# Patient Record
Sex: Female | Born: 1960 | Race: White | Hispanic: No | Marital: Married | Smoking: Former smoker
Health system: Southern US, Community
[De-identification: ages and names within clinical notes are randomized; demographics above are authoritative.]

## PROBLEM LIST (undated history)

## (undated) DIAGNOSIS — K8020 Calculus of gallbladder without cholecystitis without obstruction: Secondary | ICD-10-CM

## (undated) DIAGNOSIS — D649 Anemia, unspecified: Secondary | ICD-10-CM

## (undated) DIAGNOSIS — F329 Major depressive disorder, single episode, unspecified: Secondary | ICD-10-CM

## (undated) DIAGNOSIS — F32A Depression, unspecified: Secondary | ICD-10-CM

## (undated) DIAGNOSIS — E669 Obesity, unspecified: Secondary | ICD-10-CM

## (undated) DIAGNOSIS — K219 Gastro-esophageal reflux disease without esophagitis: Secondary | ICD-10-CM

## (undated) DIAGNOSIS — F419 Anxiety disorder, unspecified: Secondary | ICD-10-CM

## (undated) HISTORY — PX: CHOLECYSTECTOMY: SHX55

## (undated) HISTORY — DX: Major depressive disorder, single episode, unspecified: F32.9

## (undated) HISTORY — DX: Depression, unspecified: F32.A

## (undated) HISTORY — DX: Anxiety disorder, unspecified: F41.9

## (undated) HISTORY — DX: Gastro-esophageal reflux disease without esophagitis: K21.9

## (undated) HISTORY — DX: Anemia, unspecified: D64.9

## (undated) HISTORY — DX: Obesity, unspecified: E66.9

## (undated) HISTORY — DX: Calculus of gallbladder without cholecystitis without obstruction: K80.20

---

## 2000-03-22 ENCOUNTER — Encounter: Admission: RE | Admit: 2000-03-22 | Discharge: 2000-03-22 | Payer: Self-pay | Admitting: Family Medicine

## 2000-03-22 ENCOUNTER — Encounter: Payer: Self-pay | Admitting: Family Medicine

## 2001-03-25 ENCOUNTER — Encounter: Payer: Self-pay | Admitting: Family Medicine

## 2001-03-25 ENCOUNTER — Encounter: Admission: RE | Admit: 2001-03-25 | Discharge: 2001-03-25 | Payer: Self-pay | Admitting: Family Medicine

## 2001-03-25 ENCOUNTER — Encounter: Payer: Self-pay | Admitting: Internal Medicine

## 2005-09-04 ENCOUNTER — Ambulatory Visit (HOSPITAL_COMMUNITY): Payer: Self-pay | Admitting: Psychiatry

## 2005-11-28 ENCOUNTER — Ambulatory Visit (HOSPITAL_COMMUNITY): Payer: Self-pay | Admitting: Psychiatry

## 2006-01-28 ENCOUNTER — Ambulatory Visit (HOSPITAL_COMMUNITY): Payer: Self-pay | Admitting: Psychiatry

## 2006-04-24 ENCOUNTER — Ambulatory Visit (HOSPITAL_COMMUNITY): Payer: Self-pay | Admitting: Psychiatry

## 2006-07-24 ENCOUNTER — Ambulatory Visit (HOSPITAL_COMMUNITY): Payer: Self-pay | Admitting: Psychiatry

## 2006-08-21 ENCOUNTER — Ambulatory Visit (HOSPITAL_COMMUNITY): Payer: Self-pay | Admitting: Psychiatry

## 2006-10-15 ENCOUNTER — Ambulatory Visit (HOSPITAL_COMMUNITY): Payer: Self-pay | Admitting: Psychiatry

## 2007-04-11 ENCOUNTER — Ambulatory Visit (HOSPITAL_COMMUNITY): Payer: Self-pay | Admitting: Psychiatry

## 2007-10-03 ENCOUNTER — Ambulatory Visit (HOSPITAL_COMMUNITY): Payer: Self-pay | Admitting: Psychiatry

## 2008-04-09 ENCOUNTER — Ambulatory Visit (HOSPITAL_COMMUNITY): Payer: Self-pay | Admitting: Psychiatry

## 2008-04-28 ENCOUNTER — Ambulatory Visit: Payer: Self-pay | Admitting: Obstetrics & Gynecology

## 2008-04-28 ENCOUNTER — Encounter: Payer: Self-pay | Admitting: Physician Assistant

## 2008-05-04 ENCOUNTER — Ambulatory Visit (HOSPITAL_COMMUNITY): Admission: RE | Admit: 2008-05-04 | Discharge: 2008-05-04 | Payer: Self-pay | Admitting: Family Medicine

## 2008-05-13 ENCOUNTER — Ambulatory Visit: Payer: Self-pay | Admitting: Obstetrics & Gynecology

## 2008-10-07 ENCOUNTER — Ambulatory Visit (HOSPITAL_COMMUNITY): Payer: Self-pay | Admitting: Psychiatry

## 2008-12-31 ENCOUNTER — Ambulatory Visit (HOSPITAL_COMMUNITY): Payer: Self-pay | Admitting: Psychiatry

## 2009-03-18 ENCOUNTER — Ambulatory Visit (HOSPITAL_COMMUNITY): Payer: Self-pay | Admitting: Psychiatry

## 2009-05-06 ENCOUNTER — Ambulatory Visit (HOSPITAL_COMMUNITY): Payer: Self-pay | Admitting: Psychiatry

## 2009-05-10 ENCOUNTER — Ambulatory Visit (HOSPITAL_COMMUNITY): Payer: Self-pay | Admitting: Psychiatry

## 2009-05-18 ENCOUNTER — Ambulatory Visit (HOSPITAL_COMMUNITY): Payer: Self-pay | Admitting: Psychiatry

## 2009-05-25 ENCOUNTER — Ambulatory Visit (HOSPITAL_COMMUNITY): Payer: Self-pay | Admitting: Psychiatry

## 2009-06-02 ENCOUNTER — Ambulatory Visit (HOSPITAL_COMMUNITY): Payer: Self-pay | Admitting: Psychiatry

## 2009-06-16 ENCOUNTER — Ambulatory Visit (HOSPITAL_COMMUNITY): Payer: Self-pay | Admitting: Psychiatry

## 2009-06-23 ENCOUNTER — Ambulatory Visit (HOSPITAL_COMMUNITY): Payer: Self-pay | Admitting: Psychiatry

## 2009-07-06 ENCOUNTER — Ambulatory Visit: Payer: Self-pay | Admitting: Internal Medicine

## 2009-07-06 ENCOUNTER — Encounter: Payer: Self-pay | Admitting: Internal Medicine

## 2009-07-06 ENCOUNTER — Ambulatory Visit (HOSPITAL_COMMUNITY): Admission: RE | Admit: 2009-07-06 | Discharge: 2009-07-06 | Payer: Self-pay | Admitting: Internal Medicine

## 2009-07-06 DIAGNOSIS — IMO0002 Reserved for concepts with insufficient information to code with codable children: Secondary | ICD-10-CM

## 2009-07-06 DIAGNOSIS — I10 Essential (primary) hypertension: Secondary | ICD-10-CM | POA: Insufficient documentation

## 2009-07-06 DIAGNOSIS — F341 Dysthymic disorder: Secondary | ICD-10-CM

## 2009-07-06 DIAGNOSIS — K219 Gastro-esophageal reflux disease without esophagitis: Secondary | ICD-10-CM | POA: Insufficient documentation

## 2009-07-06 LAB — CONVERTED CEMR LAB
Eosinophils Absolute: 0.1 10*3/uL (ref 0.0–0.7)
Eosinophils Relative: 1 % (ref 0–5)
HCT: 35.9 % — ABNORMAL LOW (ref 36.0–46.0)
Lymphs Abs: 2.4 10*3/uL (ref 0.7–4.0)
MCV: 85.3 fL (ref >=78.0)
Monocytes Relative: 9 % (ref 3–12)
Neutrophils Relative %: 49 % (ref 43–77)
RBC: 4.21 M/uL (ref 3.87–5.11)
WBC: 5.8 10*3/uL (ref 4.0–10.5)

## 2009-07-07 DIAGNOSIS — I10 Essential (primary) hypertension: Secondary | ICD-10-CM | POA: Insufficient documentation

## 2009-07-07 DIAGNOSIS — F329 Major depressive disorder, single episode, unspecified: Secondary | ICD-10-CM

## 2009-07-07 DIAGNOSIS — F411 Generalized anxiety disorder: Secondary | ICD-10-CM | POA: Insufficient documentation

## 2009-07-07 DIAGNOSIS — M545 Low back pain: Secondary | ICD-10-CM

## 2009-07-21 ENCOUNTER — Ambulatory Visit: Payer: Self-pay | Admitting: Obstetrics and Gynecology

## 2009-07-22 ENCOUNTER — Ambulatory Visit: Payer: Self-pay | Admitting: Internal Medicine

## 2009-07-22 DIAGNOSIS — G2581 Restless legs syndrome: Secondary | ICD-10-CM

## 2009-07-27 ENCOUNTER — Ambulatory Visit (HOSPITAL_COMMUNITY): Payer: Self-pay | Admitting: Psychiatry

## 2009-08-01 ENCOUNTER — Ambulatory Visit (HOSPITAL_COMMUNITY): Payer: Self-pay | Admitting: Psychiatry

## 2009-08-12 ENCOUNTER — Ambulatory Visit (HOSPITAL_COMMUNITY): Payer: Self-pay | Admitting: Psychiatry

## 2009-08-30 ENCOUNTER — Ambulatory Visit (HOSPITAL_COMMUNITY): Payer: Self-pay | Admitting: Psychiatry

## 2009-09-07 ENCOUNTER — Ambulatory Visit (HOSPITAL_COMMUNITY): Payer: Self-pay | Admitting: Psychiatry

## 2009-09-19 IMAGING — US US PELVIS COMPLETE MODIFY
1 series · 14 of 25 positions shown · non-contrast
Comparison: None

CLINICAL DATA: Pelvic pain

TRANSABDOMINAL AND TRANSVAGINAL ULTRASOUND OF PELVIS
TECHNIQUE: Both transabdominal and transvaginal ultrasound
examinations of the pelvis were performed including evaluation of
the uterus, ovaries, adnexal regions, and pelvic cul-de-sac.

[Series 1: us pelvis complete modify · 0.28mm/px · 14 of 39 slices shown]
[im 1/39]
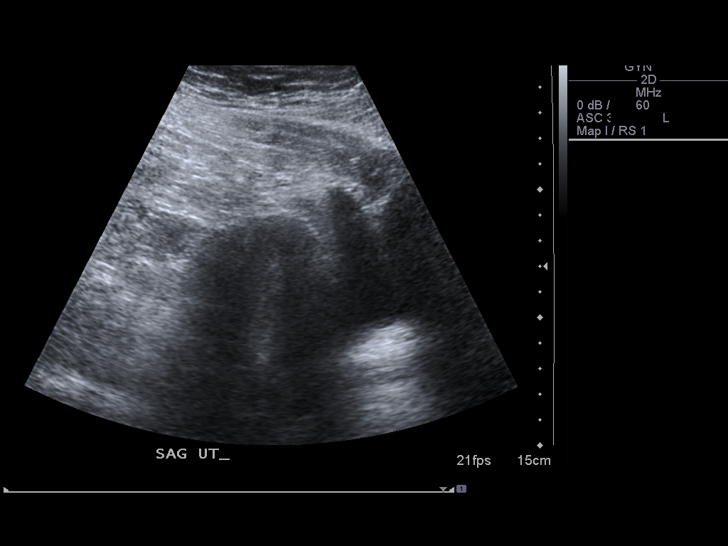
[im 4/39]
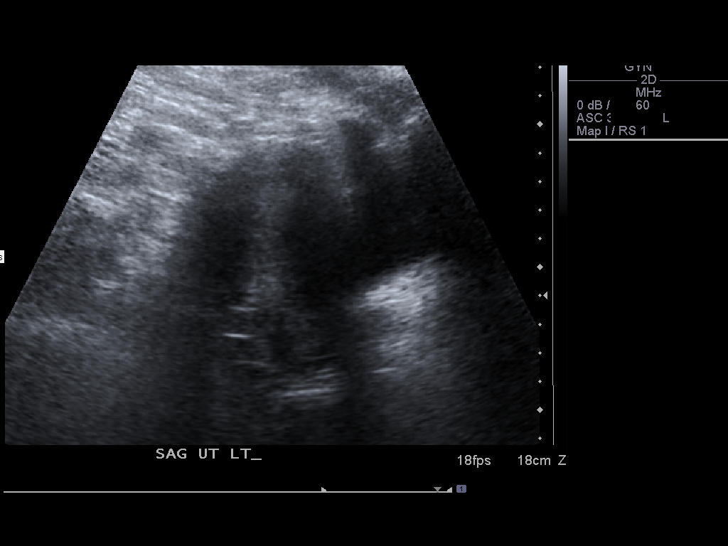
[im 7/39]
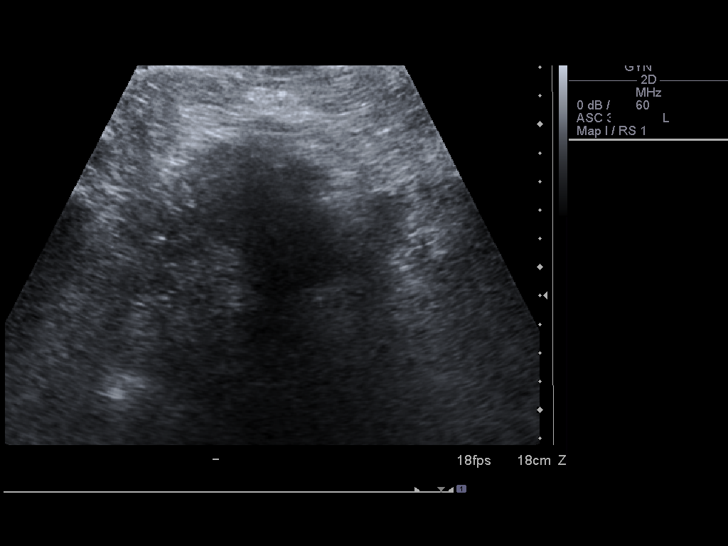
[im 10/39]
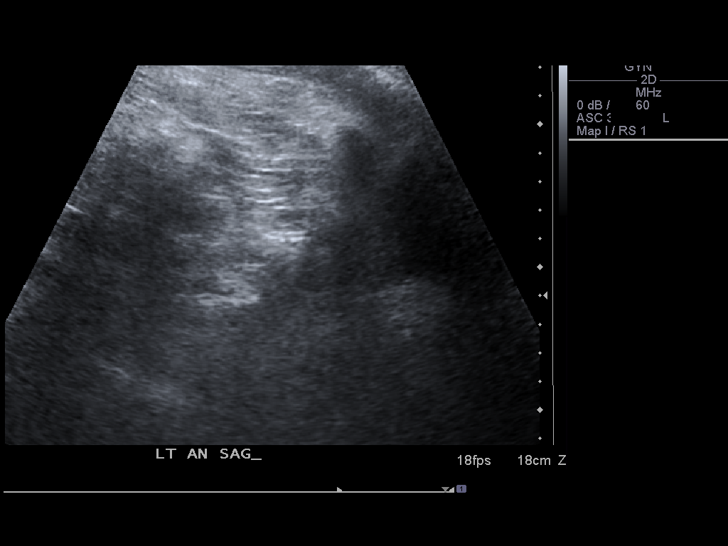
[im 13/39]
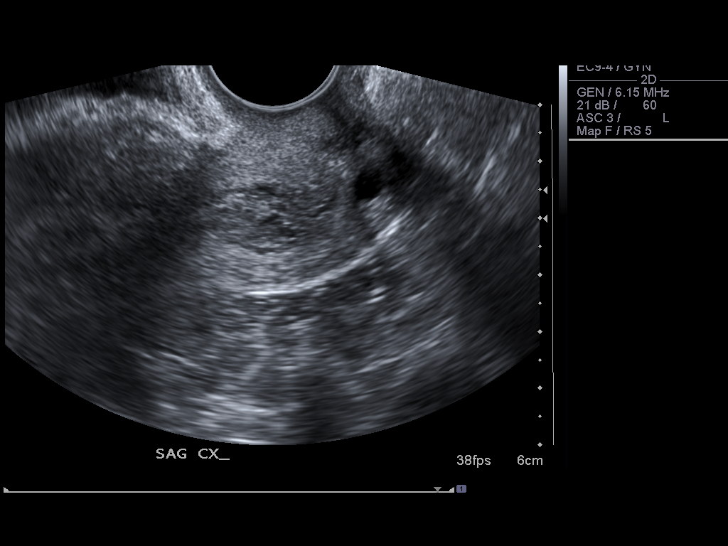
[im 15/39]
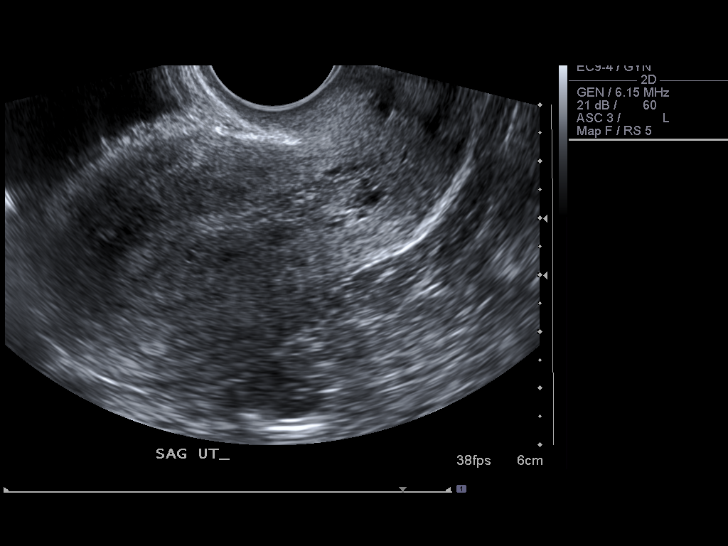
[im 18/39]
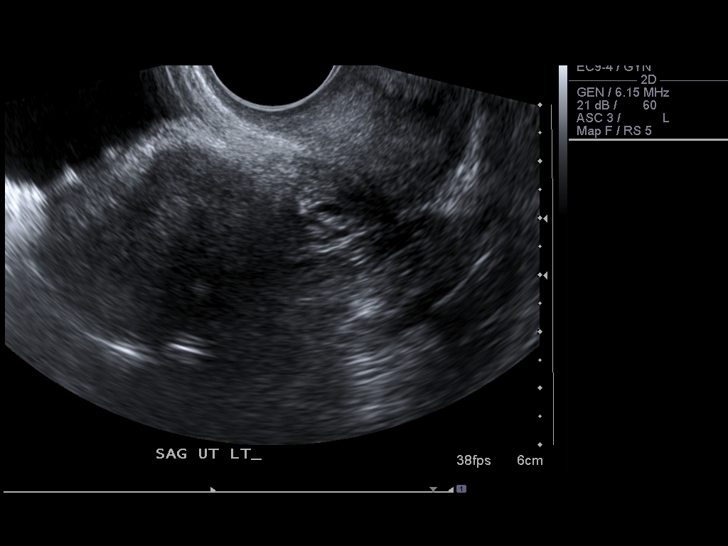
[im 21/39]
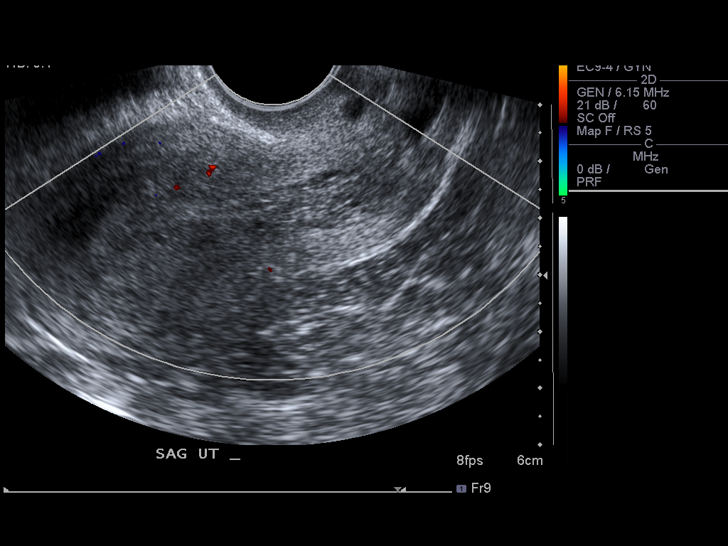
[im 24/39]
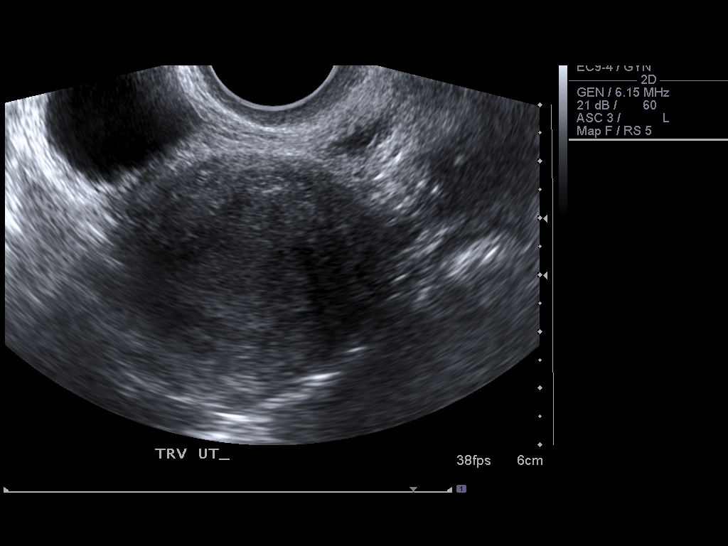
[im 26/39]
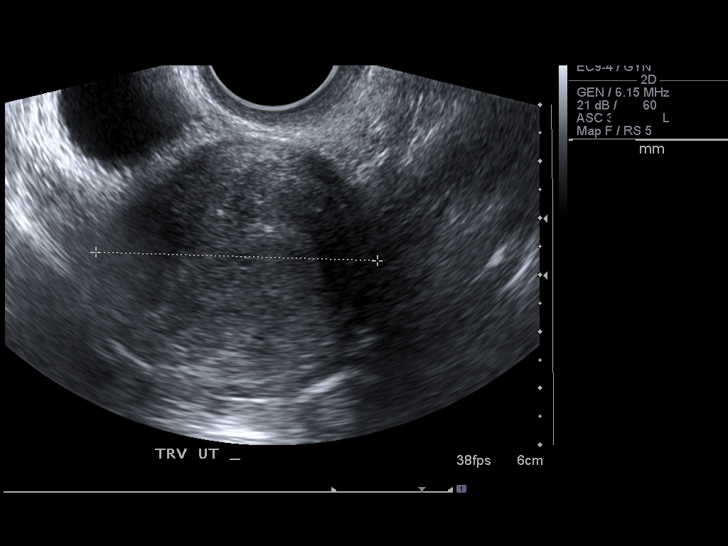
[im 29/39]
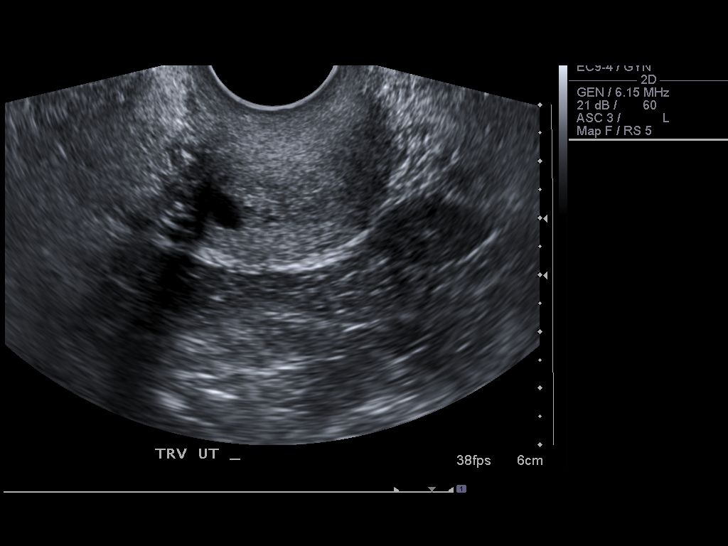
[im 32/39]
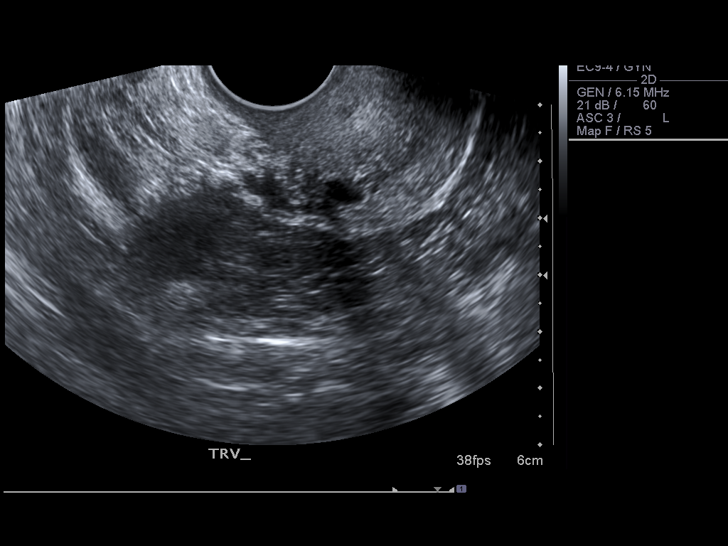
[im 35/39]
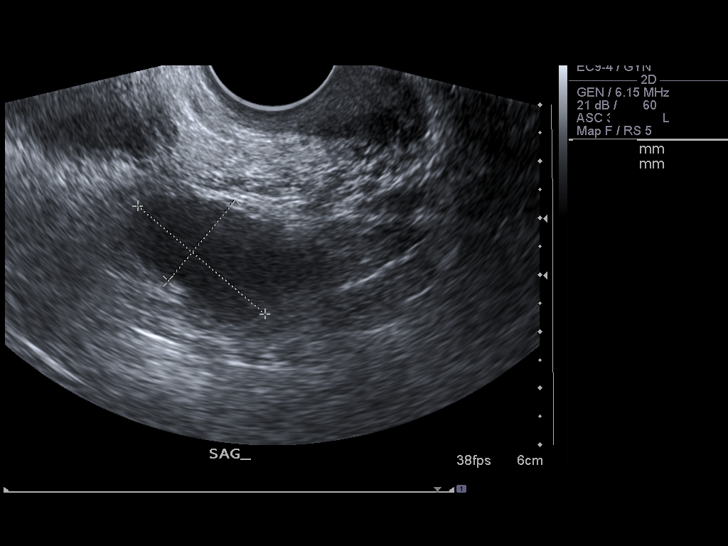
[im 39/39]
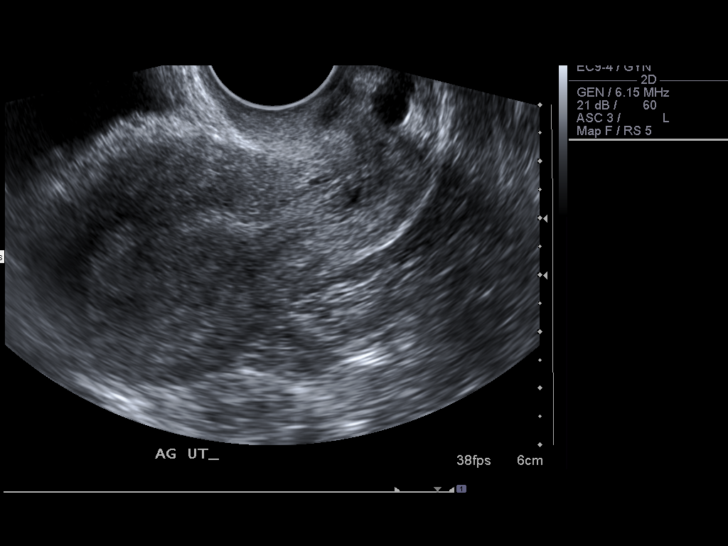

[14 of 25 positions shown; findings below may reference images not displayed]

FINDINGS: The uterus has a normal size and echotexture.  The
uterine dimensions are 8.1 x 4.3 x 5.0 cm.  The endometrium is
homogeneous and upper limits of normal in width, measuring 13 mm.

Both ovaries have a normal size and appearance.  The right ovary
measures 2.1 x 2.0 x 1.4 cm, and the left ovary measures 2.9 x
x 1.9 cm.  No adnexal masses or free pelvic fluid are identified.
IMPRESSION: Normal pelvic ultrasound.

## 2009-09-20 ENCOUNTER — Ambulatory Visit (HOSPITAL_COMMUNITY): Payer: Self-pay | Admitting: Psychiatry

## 2009-10-04 ENCOUNTER — Ambulatory Visit (HOSPITAL_COMMUNITY): Payer: Self-pay | Admitting: Psychiatry

## 2009-10-18 ENCOUNTER — Ambulatory Visit (HOSPITAL_COMMUNITY): Payer: Self-pay | Admitting: Psychiatry

## 2009-10-20 ENCOUNTER — Ambulatory Visit: Payer: Self-pay | Admitting: Internal Medicine

## 2009-10-20 DIAGNOSIS — T148XXA Other injury of unspecified body region, initial encounter: Secondary | ICD-10-CM | POA: Insufficient documentation

## 2009-10-20 LAB — CONVERTED CEMR LAB
Cocaine Metabolites: NEGATIVE
Creatinine,U: 42.5 mg/dL
Opiates: NEGATIVE
Phencyclidine (PCP): NEGATIVE

## 2009-10-25 ENCOUNTER — Ambulatory Visit: Payer: Self-pay | Admitting: Infectious Disease

## 2009-11-02 ENCOUNTER — Ambulatory Visit (HOSPITAL_COMMUNITY): Payer: Self-pay | Admitting: Psychiatry

## 2009-11-03 LAB — CONVERTED CEMR LAB
Eosinophils Relative: 1 % (ref 0–5)
HCT: 31 % — ABNORMAL LOW (ref 36.0–46.0)
Hemoglobin: 10.1 g/dL — ABNORMAL LOW (ref 12.0–15.0)
Lymphocytes Relative: 34 % (ref 12–46)
Lymphs Abs: 2 10*3/uL (ref 0.7–4.0)
Monocytes Absolute: 0.5 10*3/uL (ref 0.1–1.0)
Monocytes Relative: 9 % (ref 3–12)
RBC: 3.8 M/uL — ABNORMAL LOW (ref 3.87–5.11)
RDW: 15.1 % (ref 11.5–15.5)
WBC: 5.8 10*3/uL (ref 4.0–10.5)

## 2009-11-04 ENCOUNTER — Ambulatory Visit (HOSPITAL_COMMUNITY): Payer: Self-pay | Admitting: Psychiatry

## 2009-11-09 ENCOUNTER — Encounter: Admission: RE | Admit: 2009-11-09 | Discharge: 2009-12-14 | Payer: Self-pay | Admitting: Internal Medicine

## 2009-11-10 ENCOUNTER — Encounter: Payer: Self-pay | Admitting: Internal Medicine

## 2009-11-17 ENCOUNTER — Encounter (INDEPENDENT_AMBULATORY_CARE_PROVIDER_SITE_OTHER): Payer: Self-pay | Admitting: Internal Medicine

## 2009-11-17 ENCOUNTER — Ambulatory Visit (HOSPITAL_COMMUNITY): Payer: Self-pay | Admitting: Psychiatry

## 2009-11-17 ENCOUNTER — Ambulatory Visit: Payer: Self-pay | Admitting: Internal Medicine

## 2009-11-17 DIAGNOSIS — D649 Anemia, unspecified: Secondary | ICD-10-CM

## 2009-11-17 LAB — CONVERTED CEMR LAB: RBC Folate: 398 ng/mL (ref 180–600)

## 2009-11-18 ENCOUNTER — Telehealth (INDEPENDENT_AMBULATORY_CARE_PROVIDER_SITE_OTHER): Payer: Self-pay | Admitting: Internal Medicine

## 2009-11-18 DIAGNOSIS — K0850 Unsatisfactory restoration of tooth, unspecified: Secondary | ICD-10-CM | POA: Insufficient documentation

## 2009-11-28 ENCOUNTER — Ambulatory Visit (HOSPITAL_COMMUNITY): Payer: Self-pay | Admitting: Psychiatry

## 2009-12-02 ENCOUNTER — Encounter (INDEPENDENT_AMBULATORY_CARE_PROVIDER_SITE_OTHER): Payer: Self-pay | Admitting: *Deleted

## 2009-12-06 ENCOUNTER — Ambulatory Visit: Payer: Self-pay | Admitting: Internal Medicine

## 2009-12-06 DIAGNOSIS — K921 Melena: Secondary | ICD-10-CM | POA: Insufficient documentation

## 2009-12-06 LAB — CONVERTED CEMR LAB
Ferritin: 9 ng/mL — ABNORMAL LOW (ref 10–291)
Saturation Ratios: 8 % — ABNORMAL LOW (ref 20–55)
TIBC: 430 ug/dL (ref 250–470)
UIBC: 396 ug/dL
Vitamin B-12: 362 pg/mL (ref 211–911)

## 2009-12-13 ENCOUNTER — Encounter: Payer: Self-pay | Admitting: Internal Medicine

## 2009-12-14 ENCOUNTER — Ambulatory Visit: Payer: Self-pay | Admitting: Internal Medicine

## 2009-12-29 ENCOUNTER — Ambulatory Visit (HOSPITAL_COMMUNITY): Payer: Self-pay | Admitting: Psychiatry

## 2010-01-10 ENCOUNTER — Ambulatory Visit: Payer: Self-pay | Admitting: Gastroenterology

## 2010-01-11 ENCOUNTER — Telehealth: Payer: Self-pay | Admitting: Gastroenterology

## 2010-01-12 ENCOUNTER — Telehealth: Payer: Self-pay | Admitting: Internal Medicine

## 2010-01-13 ENCOUNTER — Ambulatory Visit (HOSPITAL_COMMUNITY): Payer: Self-pay | Admitting: Psychiatry

## 2010-01-18 ENCOUNTER — Telehealth: Payer: Self-pay | Admitting: Internal Medicine

## 2010-01-31 ENCOUNTER — Ambulatory Visit (HOSPITAL_COMMUNITY): Payer: Self-pay | Admitting: Psychiatry

## 2010-02-03 ENCOUNTER — Ambulatory Visit (HOSPITAL_COMMUNITY): Payer: Self-pay | Admitting: Psychiatry

## 2010-02-14 ENCOUNTER — Ambulatory Visit: Payer: Self-pay | Admitting: Gastroenterology

## 2010-02-14 LAB — HM COLONOSCOPY: HM Colonoscopy: NEGATIVE

## 2010-02-16 ENCOUNTER — Telehealth: Payer: Self-pay | Admitting: Gastroenterology

## 2010-02-16 ENCOUNTER — Encounter (INDEPENDENT_AMBULATORY_CARE_PROVIDER_SITE_OTHER): Payer: Self-pay | Admitting: *Deleted

## 2010-02-17 ENCOUNTER — Ambulatory Visit: Payer: Self-pay | Admitting: Gastroenterology

## 2010-02-17 ENCOUNTER — Telehealth: Payer: Self-pay | Admitting: *Deleted

## 2010-02-23 ENCOUNTER — Ambulatory Visit: Payer: Self-pay | Admitting: Gastroenterology

## 2010-02-24 ENCOUNTER — Ambulatory Visit (HOSPITAL_COMMUNITY): Payer: Self-pay | Admitting: Psychiatry

## 2010-02-24 ENCOUNTER — Ambulatory Visit: Payer: Self-pay | Admitting: Gastroenterology

## 2010-02-27 ENCOUNTER — Telehealth: Payer: Self-pay | Admitting: Gastroenterology

## 2010-02-27 LAB — CONVERTED CEMR LAB
Eosinophils Relative: 1.4 % (ref 0.0–5.0)
Lymphocytes Relative: 29 % (ref 12.0–46.0)
MCV: 80.4 fL (ref 78.0–100.0)
Monocytes Absolute: 0.5 10*3/uL (ref 0.1–1.0)
Monocytes Relative: 9.6 % (ref 3.0–12.0)
Neutrophils Relative %: 59.5 % (ref 43.0–77.0)
Platelets: 273 10*3/uL (ref 150.0–400.0)
WBC: 5.3 10*3/uL (ref 4.5–10.5)

## 2010-03-03 ENCOUNTER — Ambulatory Visit: Payer: Self-pay | Admitting: Internal Medicine

## 2010-03-03 DIAGNOSIS — K648 Other hemorrhoids: Secondary | ICD-10-CM | POA: Insufficient documentation

## 2010-03-09 ENCOUNTER — Ambulatory Visit (HOSPITAL_COMMUNITY): Payer: Self-pay | Admitting: Psychiatry

## 2010-03-16 ENCOUNTER — Telehealth: Payer: Self-pay | Admitting: Gastroenterology

## 2010-03-17 ENCOUNTER — Telehealth: Payer: Self-pay | Admitting: Internal Medicine

## 2010-03-20 ENCOUNTER — Ambulatory Visit: Payer: Self-pay | Admitting: Gastroenterology

## 2010-03-27 ENCOUNTER — Encounter: Payer: Self-pay | Admitting: Gastroenterology

## 2010-03-30 ENCOUNTER — Ambulatory Visit (HOSPITAL_COMMUNITY): Payer: Self-pay | Admitting: Psychiatry

## 2010-04-19 ENCOUNTER — Telehealth: Payer: Self-pay | Admitting: *Deleted

## 2010-05-04 ENCOUNTER — Ambulatory Visit (HOSPITAL_COMMUNITY): Payer: Self-pay | Admitting: Psychiatry

## 2010-05-17 ENCOUNTER — Ambulatory Visit (HOSPITAL_COMMUNITY)
Admission: RE | Admit: 2010-05-17 | Discharge: 2010-05-17 | Payer: Self-pay | Source: Home / Self Care | Attending: Internal Medicine | Admitting: Internal Medicine

## 2010-05-17 ENCOUNTER — Ambulatory Visit: Payer: Self-pay | Admitting: Internal Medicine

## 2010-05-17 DIAGNOSIS — M79609 Pain in unspecified limb: Secondary | ICD-10-CM | POA: Insufficient documentation

## 2010-05-24 ENCOUNTER — Ambulatory Visit (HOSPITAL_COMMUNITY)
Admission: RE | Admit: 2010-05-24 | Discharge: 2010-05-24 | Payer: Self-pay | Source: Home / Self Care | Attending: Internal Medicine | Admitting: Internal Medicine

## 2010-05-24 LAB — HM MAMMOGRAPHY: HM Mammogram: NEGATIVE

## 2010-06-05 ENCOUNTER — Ambulatory Visit (HOSPITAL_COMMUNITY): Payer: Self-pay | Admitting: Psychiatry

## 2010-06-13 ENCOUNTER — Ambulatory Visit
Admission: RE | Admit: 2010-06-13 | Discharge: 2010-06-13 | Payer: Self-pay | Source: Home / Self Care | Attending: Family Medicine | Admitting: Family Medicine

## 2010-06-13 DIAGNOSIS — M722 Plantar fascial fibromatosis: Secondary | ICD-10-CM | POA: Insufficient documentation

## 2010-06-20 ENCOUNTER — Ambulatory Visit (HOSPITAL_COMMUNITY): Admission: RE | Admit: 2010-06-20 | Payer: Self-pay | Source: Home / Self Care | Admitting: Psychiatry

## 2010-06-21 ENCOUNTER — Telehealth: Payer: Self-pay | Admitting: *Deleted

## 2010-06-25 ENCOUNTER — Encounter: Payer: Self-pay | Admitting: Internal Medicine

## 2010-06-26 ENCOUNTER — Encounter: Payer: Self-pay | Admitting: Infectious Diseases

## 2010-07-03 ENCOUNTER — Telehealth (INDEPENDENT_AMBULATORY_CARE_PROVIDER_SITE_OTHER): Payer: Self-pay | Admitting: *Deleted

## 2010-07-04 NOTE — Progress Notes (Signed)
Summary: Mailed pts hemoccult cards today  Phone Note Outgoing Call Call back at [facility] Phone [phone]   Call placed by: Merri Ray CMA Duncan Dull),  February 27, 2010 1:41 PM Summary of Call: Called pt to inform that I am mailing out her hemoccult cards today recommended by Dr Arlyce Dice.L/M to return call if she has any questions Initial call taken by: Merri Ray CMA Duncan Dull),  February 27, 2010 1:42 PM

## 2010-07-04 NOTE — Assessment & Plan Note (Signed)
Summary: ACUTE/MADERA/PER PT NEEDS OV AFTER ENDOSCOPY DONE/CH   Vital Signs:  Patient profile:   50 year old female Height:      66 inches (167.64 cm) Weight:      227.9 pounds (103.59 kg) BMI:     36.92 Temp:     97.7 degrees F oral Pulse rate:   86 / minute BP sitting:   130 / 79  (right arm) Cuff size:   regular  Vitals Entered By: Chinita Pester RN (March 03, 2010 3:14 PM) CC: Medication refills. States iron pills make her sick (diarrhea). Bleeding hemorrhoids. Flu shot. Is Patient Diabetic? No Pain Assessment Patient in pain? yes     Location: left leg Intensity: 4 Type: aching Onset of pain  Intermittent;recently took pain med. Nutritional Status BMI of > 30 = obese  Have you ever been in a relationship where you felt threatened, hurt or afraid?No   Does patient need assistance? Functional Status Self care Ambulation Normal   Primary Care Provider:  Vassie Loll MD  CC:  Medication refills. States iron pills make her sick (diarrhea). Bleeding hemorrhoids. Flu shot..  History of Present Illness: 50 y/o w with pmf of hiatal hernia, diverticulosis and hemorrhoids and iron def anemia from GI bleed in past comes for prescriprion refill. she is currently having some streaky blood in her stool which she has been having since a while. she has not had frank blood in stool. has cbc followed at [facility]astro[facility]ist[facility] office. no new complaiints today except itching at hemorrhoidal site.   Depression History:      The patient denies a depressed mood most of the day and a diminished interest in her usual daily activities.        Comments:  "Ups and downs;especially this last week.".   Preventive Screening-Counseling & Management  Alcohol-Tobacco     Alcohol drinks/day: 0     Smoking Status: quit  Caffeine-Diet-Exercise     Does Patient Exercise: no  Current Medications (verified): 1)  Alprazolam 1 Mg Tabs (Alprazolam) .... Take 1 Tablet By Mouth Upto 5 Times A Day 2)   Zolpidem Tartrate 10 Mg Tabs (Zolpidem Tartrate) .... Take 1 Tab By Mouth At Bedtime As Needed As Needed For Insomnia. 3)  Fluoxetine Hcl 40 Mg Caps (Fluoxetine Hcl) .... Take 1 Tablet By Mouth Once A Day 4)  Flexeril 10 Mg Tabs (Cyclobenzaprine Hcl) .... Take 1 Tablet By Mouth Two Times A Day 5)  Omeprazole 20 Mg Cpdr (Omeprazole) .... Take 1 Capsule By Mouth Twice Daily. 6)  Vicodin 5-500 Mg Tabs (Hydrocodone-Acetaminophen) .... Take 1 Tablet Three Time A Day As Needed For Pain. 7)  Hydrocortisone Acetate 25 Mg Supp (Hydrocortisone Acetate) .... 2-3 Times A Day As Needed For Hemorrhoids  Allergies (verified): No Known Drug Allergies  Review of Systems  The patient denies anorexia, fever, weight loss, weight gain, vision loss, decreased hearing, hoarseness, chest pain, syncope, dyspnea on exertion, peripheral edema, prolonged cough, headaches, hemoptysis, abdominal pain, melena, hematochezia, severe indigestion/heartburn, hematuria, incontinence, genital sores, muscle weakness, suspicious skin lesions, transient blindness, difficulty walking, depression, unusual weight change, abnormal bleeding, enlarged lymph nodes, angioedema, breast masses, and testicular masses.    Physical Exam  General:  alert, well-developed, and well-nourished.   Head:  normocephalic and atraumatic.   Eyes:  vision grossly intact, pupils equal, pupils round, and pupils reactive to light.   Ears:  R ear normal and L ear normal.   Nose:  no external deformity.   Neck:  supple, full ROM, and no masses.   Chest Wall:  no deformities.   Lungs:  normal respiratory effort, no intercostal retractions, no accessory muscle use, normal breath sounds, and no wheezes.   Heart:  normal rate, regular rhythm, and no murmur.   Abdomen:  soft, non-tender, normal bowel sounds, and no distention.   Msk:  normal ROM and no joint tenderness.   Pulses:  dorsalis pedis pulses normal bilaterally  Extremities:  no edema Neurologic:  non  focal   Impression & Recommendations:  Problem # 1:  HYPERTENSION (ICD-401.9) welll controlled.   BP today: 130/79 Prior BP: 148/98 (01/10/2010)  Labs Reviewed: K+: 4.7 (07/06/2009) Creat: : 0.77 (07/06/2009)   Chol: 182 (07/06/2009)   HDL: 67 (07/06/2009)   LDL: 99 (07/06/2009)   TG: 80 (07/06/2009)  Problem # 2:  GERD (ICD-530.81) will give prescription of ppi  Her updated medication list for this problem includes:    Omeprazole 20 Mg Cpdr (Omeprazole) .Marland Kitchen... Take 1 capsule by mouth twice daily.  EGD: DONE (02/23/2010)  Labs Reviewed: Hgb: 10.2 (02/24/2010)   Hct: 30.3 (02/24/2010)  Problem # 3:  HEMOCCULT POSITIVE STOOL (ICD-578.1) has recent cbc at [facility] office that is at baseline. no change.   Problem # 4:  HEMORRHOIDS, INTERNAL (ICD-455.0) will give steroid cream. Ask her to repeat hemoccult cards once hemmrhoidal inflammation subsides. will ask her to return to clinic in case she starts having frank hematochezia  Complete Medication List: 1)  Alprazolam 1 Mg Tabs (Alprazolam) .... Take 1 tablet by mouth upto 5 times a day 2)  Zolpidem Tartrate 10 Mg Tabs (Zolpidem tartrate) .... Take 1 tab by mouth at bedtime as needed as needed for insomnia. 3)  Fluoxetine Hcl 40 Mg Caps (Fluoxetine hcl) .... Take 1 tablet by mouth once a day 4)  Flexeril 10 Mg Tabs (Cyclobenzaprine hcl) .... Take 1 tablet by mouth two times a day 5)  Omeprazole 20 Mg Cpdr (Omeprazole) .... Take 1 capsule by mouth twice daily. 6)  Vicodin 5-500 Mg Tabs (Hydrocodone-acetaminophen) .... Take 1 tablet three time a day as needed for pain. 7)  Hydrocortisone Acetate 25 Mg Supp (Hydrocortisone acetate) .... 2-3 times a day as needed for hemorrhoids  Other Orders: Influenza Vaccine NON MCR (16109)  Patient Instructions: 1)  Please schedule a follow-up appointment in 2 months. Come fasting at that time Prescriptions: HYDROCORTISONE ACETATE 25 MG SUPP (HYDROCORTISONE ACETATE) 2-3 times a day as needed for  hemorrhoids  #3 x 6   Entered and Authorized by:   Bethel Born MD   Signed by:   Bethel Born MD on 03/03/2010   Method used:   Faxed to ...       [facility] Department (retail)       [address] [place], [place]  [address]       Ph: [phone]       Fax: [phone]   RxID:   [id number] OMEPRAZOLE 20 MG CPDR (OMEPRAZOLE) Take 1 capsule by mouth twice daily.  #62 x 6   Entered and Authorized by:   Bethel Born MD   Signed by:   Bethel Born MD on 03/03/2010   Method used:   Faxed to ...       [facility] Department (retail)       [address] [place], [place]  [address]       Ph: [phone]  Fax: [phone]   RxID:   [id number] FLEXERIL 10 MG TABS (CYCLOBENZAPRINE HCL) Take 1 tablet by mouth two times a day  #62 x 6   Entered and Authorized by:   Bethel Born MD   Signed by:   Bethel Born MD on 03/03/2010   Method used:   Faxed to ...       [facility] Department (retail)       [address] [place], [place]  [address]       Ph: [phone]       Fax: [phone]   RxID:   [id number]   Prevention & Chronic Care Immunizations   Influenza vaccine: Fluvax Non-MCR  (03/03/2010)   Influenza vaccine deferral: Deferred  (12/14/2009)    Tetanus booster: Not documented   Td booster deferral: Deferred  (12/14/2009)    Pneumococcal vaccine: Not documented   Pneumococcal vaccine deferral: Deferred  (07/06/2009)  Other Screening   Pap smear: Not documented   Pap smear action/deferral: Deferred  (12/14/2009)    Mammogram: Not documented   Mammogram action/deferral: Ordered  (12/14/2009)   Smoking status: quit  (03/03/2010)  Lipids   Total Cholesterol: 182  (07/06/2009)   LDL: 99  (07/06/2009)   LDL Direct: Not documented   HDL: 67  (07/06/2009)   Triglycerides: 80  (07/06/2009)  Hypertension   Last Blood Pressure: 130 / 79  (03/03/2010)   Serum creatinine: 0.77  (07/06/2009)    Serum potassium 4.7  (07/06/2009)  Self-Management Support :   Personal Goals (by the next clinic visit) :      Personal blood pressure goal: 140/90  (07/06/2009)   Patient will work on the following items until the next clinic visit to reach self-care goals:     Medications and monitoring: take my medicines every day, check my blood pressure, bring all of my medications to every visit  (03/03/2010)     Eating: use fresh or frozen vegetables, eat foods that are low in salt, eat baked foods instead of fried foods  (03/03/2010)     Activity: take a 30 minute walk every day, take the stairs instead of the elevator, park at the far end of the parking lot  (07/06/2009)    Hypertension self-management support: Written self-care plan  (03/03/2010)   Hypertension self-care plan printed.     Influenza Vaccine    Vaccine Type: Fluvax Non-MCR    Site: right deltoid    Mfr: GlaxoSmithKline    Dose: 0.5 ml    Route: IM    Given by: Chinita Pester RN    Exp. Date: 12/02/2010    Lot #: [id number]    VIS given: 12/27/09 version given March 03, 2010.  Flu Vaccine Consent Questions    Do you have a history of severe allergic reactions to this vaccine? no    Any prior history of allergic reactions to egg and/or gelatin? no    Do you have a sensitivity to the preservative Thimersol? no    Do you have a past history of Guillan-Barre Syndrome? no    Do you currently have an acute febrile illness? no    Have you ever had a severe reaction to latex? no    Vaccine information given and explained to patient? yes    Are you currently pregnant? no

## 2010-07-04 NOTE — Procedures (Addendum)
Summary: Upper Endoscopy  Patient: Brenda Delacruz Note: All result statuses are Final unless otherwise noted.  Tests: (1) Upper Endoscopy (EGD)   EGD Upper Endoscopy       DONE     [facility]     [address]     [place], [place]  [address]          ENDOSCOPY PROCEDURE REPORT          PATIENT:  Brenda, Delacruz  MR#:  [id number]     BIRTHDATE:  [birth year], 49 yrs. old  GENDER:  female          ENDOSCOPIST:  Barbette Hair. Arlyce Dice, MD     Referred by:          PROCEDURE DATE:  02/23/2010     PROCEDURE:  EGD, diagnostic     ASA CLASS:  Class II     INDICATIONS:  iron deficiency anemia          MEDICATIONS:   Fentanyl 50 mcg IV, Versed 8 mg IV, glycopyrrolate     (Robinal) 0.2 mg IV, 0.6cc simethancone 0.6 cc PO     TOPICAL ANESTHETIC:  Exactacain Spray          DESCRIPTION OF PROCEDURE:   After the risks benefits and     alternatives of the procedure were thoroughly explained, informed     consent was obtained.  The LB [id number] endoscope was     introduced through the mouth and advanced to the third portion of     the duodenum, without limitations.  The instrument was slowly     withdrawn as the mucosa was fully examined.     <<PROCEDUREIMAGES>>          A hiatal hernia was found. 3cm sliding hiatal hernia  Otherwise     the examination was normal (see image1, image2, image3, image4,     image5, image7, and image8).    Retroflexed views revealed no     abnormalities.    The scope was then withdrawn from the patient     and the procedure completed.          COMPLICATIONS:  None          ENDOSCOPIC IMPRESSION:     1) Hiatal hernia     2) Otherwise normal examination     RECOMMENDATIONS:     1) hemmoccult stools     2) My office will arrange for you to have labs checked (CBC).          REPEAT EXAM:  No          ______________________________     Barbette Hair. Arlyce Dice, MD          CC:  Janae Sauce MD          n.     Rosalie DoctorBarbette Hair.  Tirzah Fross at 02/23/2010 08:24 AM          [place], [phone]  Note: An exclamation mark (!) indicates a result that was not dispersed into the flowsheet. Document Creation Date: 02/23/2010 8:24 AM _______________________________________________________________________  (1) Order result status: Final Collection or observation date-time: 02/23/2010 08:19 Requested date-time:  Receipt date-time:  Reported date-time:  Referring Physician:   Ordering Physician: Melvia Heaps [phone]) Specimen Source:  Source: Launa Grill Order Number: [id number] Lab site:

## 2010-07-04 NOTE — Progress Notes (Signed)
Summary: refill/gg  Phone Note Refill Request  on January 12, 2010 5:44 PM  Refills Requested: Medication #1:  FLEXERIL 10 MG TABS 1 tablet by mouth twice a day.  Method Requested: Fax to Local Pharmacy Initial call taken by: Merrie Roof RN,  January 12, 2010 5:45 PM  Follow-up for Phone Call        Refill approved-nurse to complete    New/Updated Medications: FLEXERIL 10 MG TABS (CYCLOBENZAPRINE HCL) 1 tablet by mouth at bedtime. Prescriptions: FLEXERIL 10 MG TABS (CYCLOBENZAPRINE HCL) 1 tablet by mouth at bedtime.  #31 x 1   Entered and Authorized by:   Vassie Loll MD   Signed by:   Vassie Loll MD on 01/13/2010   Method used:   Telephoned to ...       [facility] Department (retail)       [address] [place], [place]  [address]       Ph: [phone]       Fax: [phone]   RxID:   [id number]   Appended Document: refill/gg Rx called in

## 2010-07-04 NOTE — Assessment & Plan Note (Addendum)
Summary: iron def. anemia--ch.   History of Present Illness Visit Type: Initial Consult Primary GI MD: Melvia Heaps MD [facility] Primary Provider: Lorrin Mais Requesting Provider: Elna Breslow Pokharel,MD Chief Complaint: Anemia History of Present Illness:   Brenda Delacruz is a 50 year old white female referred at the request of Dr. Aleene Davidson for evaluation of anemia.  Heme-positive stool and microcytic anemia were noted on routine testing.  In May, 2011 hemoglobin was 10.1.  Ferritin level was 9.  The patient occasionally has seen small amounts of blood  mixed with her stools which she attributes to hemorrhoids.  She denies change in bowel habits, melena, abdominal or rectal pain.  She is on no gastric irritants including nonsteroidals.  She has occasional pyrosis for which he takes omeprazole daily.  She has scant, erratic menstrual periods.  Iron causes diarrhea.   GI Review of Systems    Reports abdominal pain, acid reflux, bloating, heartburn, and  nausea.      Denies belching, chest pain, dysphagia with liquids, dysphagia with solids, loss of appetite, vomiting, vomiting blood, weight loss, and  weight gain.      Reports constipation, hemorrhoids, and  rectal bleeding.     Denies anal fissure, black tarry stools, change in bowel habit, diarrhea, diverticulosis, fecal incontinence, heme positive stool, irritable bowel syndrome, jaundice, light color stool, liver problems, and  rectal pain.    Current Medications (verified): 1)  Alprazolam 1 Mg Tabs (Alprazolam) .... Take 1 Tablet By Mouth Upto 5 Times A Day 2)  Zolpidem Tartrate 10 Mg Tabs (Zolpidem Tartrate) .... Take 1 Tab By Mouth At Bedtime As Needed As Needed For Insomnia. 3)  Fluoxetine Hcl 40 Mg Caps (Fluoxetine Hcl) .... Take 1 Tablet By Mouth Once A Day 4)  Flexeril 10 Mg Tabs (Cyclobenzaprine Hcl) .Marland Kitchen.. 1 Tablet By Mouth Twice A Day. 5)  Omeprazole 20 Mg Cpdr (Omeprazole) .... Take 1 Capsule By Mouth Twice Daily. 6)  Vicodin  5-500 Mg Tabs (Hydrocodone-Acetaminophen) .... Take 1 Tablet Three Time A Day As Needed For Pain. 7)  Ferrous Sulfate 325 (65 Fe) Mg Tbec (Ferrous Sulfate) .... Take 1 Pill By Mouth Three Times A Day After Meal.  Allergies (verified): No Known Drug Allergies  Past History:  Past Medical History: Anemia Anxiety Disorder Depression Gallstones GERD Obesity  Past Surgical History: Cholecystectomy  Review of Systems       The patient complains of anxiety-new, back pain, depression-new, fatigue, headaches-new, itching, sleeping problems, and sore throat.  The patient denies allergy/sinus, anemia, arthritis/joint pain, blood in urine, breast changes/lumps, change in vision, confusion, cough, coughing up blood, fainting, fever, hearing problems, heart murmur, heart rhythm changes, menstrual pain, muscle pains/cramps, night sweats, nosebleeds, pregnancy symptoms, shortness of breath, skin rash, swelling of feet/legs, swollen lymph glands, thirst - excessive , urination - excessive , urination changes/pain, urine leakage, vision changes, and voice change.         All other systems were reviewed and were negative   Vital Signs:  Patient profile:   50 year old female Height:      66 inches Weight:      226 pounds BMI:     36.61 Pulse rate:   104 / minute Pulse rhythm:   regular BP sitting:   148 / 98  (left arm)  Vitals Entered By: Merri Ray CMA Duncan Dull) (January 10, 2010 1:48 PM)  Physical Exam  Additional Exam:  On physical exam she is a heavyset female  Physical Exam: General:  WDWN HEENT:   anicteric.  No pharyngeal abnormalities Neck:   No masses, thyroidmegaly Nodes:   No cervical, axillary, inguinal adenopathy Chest:    Clear to auscultation Cardiac:   No murmurs, gallops, rubs Abdomen:   BS active.  No abd masses, tenderness, organomegaly Rectal:   Deferred Extremities:   No cyanosis, clubbing, edema Skeletal:   No deformities Neuro:   Alert, oriented  x3.  No focal abnormalities     Impression & Recommendations:  Problem # 1:  ANEMIA (ICD-285.9)  Heme positive stool and iron deficiency anemia point toward chronic your blood loss as the source of her anemia.  Colonoscopy will be scheduled to rule out sources including polyps, AVMs or neoplasm.  Upper GI bleeding sources must also be considered though, with PPI use, peptic ulcer disease is less likely.  If colonoscopy is negative I will proceed with upper endoscopy.  Risks, alternatives, and complications of the procedure, including bleeding, perforation, and possible need for surgery, were explained to the patient.  Patient's questions were answered.  Orders: Colonoscopy (Colon)  Problem # 2:  DEPRESSION/ANXIETY (ICD-300.4) Assessment: Comment Only  Patient Instructions: 1)  Copy sent to : Elna Breslow Pokharel,MD 2)  Colonoscopy and Flexible Sigmoidoscopy brochure given.  3)  Conscious Sedation brochure given.  4)  Your Colonoscopy is scheduled for 02/14/2010 at 2:30pm 5)  You will hold IRON 7 days prior 6)  The medication list was reviewed and reconciled.  All changed / newly prescribed medications were explained.  A complete medication list was provided to the patient / caregiver. Prescriptions: MOVIPREP 100 GM  SOLR (PEG-KCL-NACL-NASULF-NA ASC-C) As per prep instructions.  #1 x 0   Entered by:   Merri Ray CMA (AAMA)   Authorized by:   Louis Meckel MD   Signed by:   Merri Ray CMA (AAMA) on 01/10/2010   Method used:   Electronically to        [facility]* (retail)       [address]       [address] Shipping/mailing       [place], [place]  [address]       Ph: [phone]       Fax: [phone]   RxID:   [id number]

## 2010-07-04 NOTE — Letter (Signed)
Summary: [facility] Instructions  [facility]  [address] [place], [place] [address]   Phone: [phone]  Fax: [phone]       [facility]    1960-09-28    MRN: [id number]        Procedure Day /Date:TUESDAY 02/14/2010     Arrival Time:1:30PM     Procedure Time:2:30PM     Location of Procedure:                    X   [facility] (4th Floor)  PREPARATION FOR COLONOSCOPY WITH MOVIPREP   Starting 5 days prior to your procedure 9/8/2011do not eat nuts, seeds, popcorn, corn, beans, peas,  salads, or any raw vegetables.  Do not take any fiber supplements (e.g. Metamucil, Citrucel, and Benefiber).  THE DAY BEFORE YOUR PROCEDURE         DATE:02/13/2010  DAY: MONDAY  1.  Drink clear liquids the entire day-NO SOLID FOOD  2.  Do not drink anything colored red or purple.  Avoid juices with pulp.  No orange juice.  3.  Drink at least 64 oz. (8 glasses) of fluid/clear liquids during the day to prevent dehydration and help the prep work efficiently.  CLEAR LIQUIDS INCLUDE: Water Jello Ice Popsicles Tea (sugar ok, no milk/cream) Powdered fruit flavored drinks Coffee (sugar ok, no milk/cream) Gatorade Juice: apple, white grape, white cranberry  Lemonade Clear bullion, consomm, broth Carbonated beverages (any kind) Strained chicken noodle soup Hard Candy                             4.  In the morning, mix first dose of MoviPrep solution:    Empty 1 Pouch A and 1 Pouch B into the disposable container    Add lukewarm drinking water to the top line of the container. Mix to dissolve    Refrigerate (mixed solution should be used within 24 hrs)  5.  Begin drinking the prep at 5:00 p.m. The MoviPrep container is divided by 4 marks.   Every 15 minutes drink the solution down to the next mark (approximately 8 oz) until the full liter is complete.   6.  Follow completed prep with 16 oz of clear liquid of your choice (Nothing red or purple).  Continue to  drink clear liquids until bedtime.  7.  Before going to bed, mix second dose of MoviPrep solution:    Empty 1 Pouch A and 1 Pouch B into the disposable container    Add lukewarm drinking water to the top line of the container. Mix to dissolve    Refrigerate  THE DAY OF YOUR PROCEDURE      DATE: 02/14/2010 DAY: TUESDAY  Beginning at 9:30a.m. (5 hours before procedure):         1. Every 15 minutes, drink the solution down to the next mark (approx 8 oz) until the full liter is complete.  2. Follow completed prep with 16 oz. of clear liquid of your choice.    3. You may drink clear liquids until 12:30PM (2 HOURS BEFORE PROCEDURE).   MEDICATION INSTRUCTIONS  Unless otherwise instructed, you should take regular prescription medications with a small sip of water   as early as possible the morning of your procedure.  HOLD IRON 7 DAYS PRIOR TO PROCEDURE         OTHER INSTRUCTIONS  You will need a responsible adult at least 50 years  of age to accompany you and drive you home.   This person must remain in the waiting room during your procedure.  Wear loose fitting clothing that is easily removed.  Leave jewelry and other valuables at home.  However, you may wish to bring a book to read or  an iPod/MP3 player to listen to music as you wait for your procedure to start.  Remove all body piercing jewelry and leave at home.  Total time from sign-in until discharge is approximately 2-3 hours.  You should go home directly after your procedure and rest.  You can resume normal activities the  day after your procedure.  The day of your procedure you should not:   Drive   Make legal decisions   Operate machinery   Drink alcohol   Return to work  You will receive specific instructions about eating, activities and medications before you leave.    The above instructions have been reviewed and explained to me by   _______________________    I fully understand and can  verbalize these instructions _____________________________ Date _________

## 2010-07-04 NOTE — Procedures (Addendum)
Summary: Colonoscopy  Patient: Brenda Delacruz Note: All result statuses are Final unless otherwise noted.  Tests: (1) Colonoscopy (COL)   COL Colonoscopy           DONE     [facility]     [address]     [place], [place]  [address]          COLONOSCOPY PROCEDURE REPORT          PATIENT:  Brenda Delacruz, Brenda Delacruz  MR#:  [id number]     BIRTHDATE:  1960/12/30, 49 yrs. old  GENDER:  female          ENDOSCOPIST:  Barbette Hair. Arlyce Dice, MD     Referred by:          PROCEDURE DATE:  02/14/2010     PROCEDURE:  Diagnostic Colonoscopy     ASA CLASS:  Class II     INDICATIONS:  1) Iron deficiency anemia  2) heme positive stool          MEDICATIONS:   Fentanyl 100 mcg IV, Versed 10 mg IV, Benadryl 25     mg IV          DESCRIPTION OF PROCEDURE:   After the risks benefits and     alternatives of the procedure were thoroughly explained, informed     consent was obtained.  Digital rectal exam was performed and     revealed no abnormalities.   The LB [id number] endoscope     was introduced through the anus and advanced to the cecum, which     was identified by both the appendix and ileocecal valve, without     limitations.  The quality of the prep was excellent, using     MoviPrep.  The instrument was then slowly withdrawn as the colon     was fully examined.     <<PROCEDUREIMAGES>>          FINDINGS:  Mild diverticulosis was found in the sigmoid colon (see     image2 and image1).  This was otherwise a normal examination of     the colon (see image3, image5, image6, image7, image8, image10,     image12, image13, image14, image17, and image16).  Internal     hemorrhoids were found (see image19 and image20).   Retroflexed     views in the rectum revealed no abnormalities.    The time to     cecum =  6.50  minutes. The scope was then withdrawn (time =  7.25     min) from the patient and the procedure completed.          COMPLICATIONS:  None          ENDOSCOPIC IMPRESSION:  1) Mild diverticulosis in the sigmoid colon     2) Internal hemorrhoids     3) Otherwise normal examination     RECOMMENDATIONS:     1) Upper endoscopy will be scheduled          REPEAT EXAM:  No          ______________________________     Barbette Hair. Arlyce Dice, MD          CC: Brenda Sauce MD          n.     Brenda DoctorBarbette Hair. Kaplan at 02/14/2010 03:18 PM          Brenda Delacruz, [id number]  Note: An exclamation mark (!) indicates a result that was not dispersed  into the flowsheet. Document Creation Date: 02/14/2010 3:18 PM _______________________________________________________________________  (1) Order result status: Final Collection or observation date-time: 02/14/2010 15:11 Requested date-time:  Receipt date-time:  Reported date-time:  Referring Physician:   Ordering Physician: Brenda Delacruz [phone]) Specimen Source:  Source: Launa Grill Order Number: [id number] Lab site:

## 2010-07-04 NOTE — Progress Notes (Signed)
Summary: Refill/gh  Phone Note Refill Request   Refills Requested: Medication #1:  VICODIN 5-500 MG TABS Take 1 tablet three time a day as needed for pain.   Last Refilled: 02/17/2010  Method Requested: Electronic Initial call taken by: Angelina Ok RN,  March 17, 2010 12:23 PM  Follow-up for Phone Call        Reviewed notes.  Pt needs to be evaluated for need to continue with opiates.  Will refill 1 month supply, but pt needs to be assessed for chronic use. Follow-up by: Mariea Stable MD,  March 17, 2010 3:31 PM    Prescriptions: VICODIN 5-500 MG TABS (HYDROCODONE-ACETAMINOPHEN) Take 1 tablet three time a day as needed for pain.  #90 x 0   Entered and Authorized by:   Mariea Stable MD   Signed by:   Mariea Stable MD on 03/17/2010   Method used:   Print then Give to Patient   RxID:   [id number]   Appended Document: Refill/gh Rx called into [facility] out patient pharmacy. Pt has scheduled appointment in November.  Appended Document: Refill/gh This patient has been sch with Dr. Gwenlyn Perking on May 03, 2010.

## 2010-07-04 NOTE — Letter (Signed)
Summary: Results Letter   Gastroenterology  [address] [place], [place] [address]   Phone: [phone]  Fax: [phone]        January 10, 2010 MRN: [id number]    [facility] [address] [place], [place]  [address]    Dear Ms. Kassebaum,  It is my pleasure to have treated you recently as a new patient in my office. I appreciate your confidence and the opportunity to participate in your care.  Since I do have a busy inpatient endoscopy schedule and office schedule, my office hours vary weekly. I am, however, available for emergency calls everyday through my office. If I am not available for an urgent office appointment, another one of our gastroenterologist will be able to assist you.  My well-trained staff are prepared to help you at all times. For emergencies after office hours, a physician from our Gastroenterology section is always available through my 24 hour answering service  Once again I welcome you as a new patient and I look forward to a happy and healthy relationship             Sincerely,  Louis Meckel MD  This letter has been electronically signed by your physician.  Appended Document: Results Letter LETTER MAILED

## 2010-07-04 NOTE — Progress Notes (Signed)
Summary: refill/gg  Phone Note Refill Request  on January 18, 2010 12:37 PM  Refills Requested: Medication #1:  OMEPRAZOLE 20 MG CPDR Take 1 capsule by mouth twice daily.   Last Refilled: 12/12/2009  Method Requested: Fax to Local Pharmacy Initial call taken by: Merrie Roof RN,  January 18, 2010 12:37 PM  Follow-up for Phone Call        Refill approved-nurse to complete    Prescriptions: OMEPRAZOLE 20 MG CPDR (OMEPRAZOLE) Take 1 capsule by mouth twice daily.  #62 x 6   Entered and Authorized by:   Vassie Loll MD   Signed by:   Vassie Loll MD on 01/18/2010   Method used:   Telephoned to ...       [facility] (retail)       [address] [place], [place]  [address]       Ph: [phone]       Fax: [phone]   RxID:   [id number]   Appended Document: refill/gg Rx faxed in

## 2010-07-04 NOTE — Progress Notes (Signed)
     New Problems: UNSPECIFIED UNSATISFACTORY RESTORATION OF TOOTH (ICD-525.60)   New Problems: UNSPECIFIED UNSATISFACTORY RESTORATION OF TOOTH (ICD-525.60) Pt asked Debbie if she can have dental referral.

## 2010-07-04 NOTE — Miscellaneous (Signed)
Summary: CBC  Clinical Lists Changes  Orders: Added new Test order of TLB-CBC Platelet - w/Differential (85025-CBCD) - Signed

## 2010-07-04 NOTE — Miscellaneous (Signed)
Summary: LEV PV  Clinical Lists Changes  Observations: Added new observation of NKA: T (02/17/2010 15:38)

## 2010-07-04 NOTE — Miscellaneous (Signed)
[facility]   Imported By: Margie Billet 11/15/2009 14:47:18  _____________________________________________________________________  External Attachment:    Type:   Image     Comment:   External Document

## 2010-07-04 NOTE — Progress Notes (Signed)
Summary: med refill/gp  Phone Note Refill Request Message from:  Fax from Pharmacy on April 19, 2010 11:46 AM  Refills Requested: Medication #1:  VICODIN 5-500 MG TABS Take 1 tablet three time a day as needed for pain.   Last Refilled: 03/20/2010 Last appt. 03/03/10; next appt. 05/01/10.   Method Requested: Telephone to Pharmacy Initial call taken by: Chinita Pester RN,  April 19, 2010 11:46 AM  Follow-up for Phone Call        Refill approved-nurse to complete. Follow-up by: Margarito Liner MD,  April 19, 2010 4:04 PM  Additional Follow-up for Phone Call Additional follow up Details #1::        Rx called in Pt informed, will keep next appointment in clinic. She did not cancel, we did. Additional Follow-up by: Merrie Roof RN,  April 19, 2010 4:12 PM    Prescriptions: VICODIN 5-500 MG TABS (HYDROCODONE-ACETAMINOPHEN) Take 1 tablet three time a day as needed for pain.  #90 x 0   Entered and Authorized by:   Margarito Liner MD   Signed by:   Margarito Liner MD on 04/19/2010   Method used:   Telephoned to ...       [facility]* (retail)       [address]       [address] Shipping/mailing       [place], [place]  [address]       Ph: [phone]       Fax: [phone]   RxID:   [id number]

## 2010-07-04 NOTE — Progress Notes (Signed)
Summary: Hemoccults  Phone Note Outgoing Call Call back at [facility] Phone [phone]   Call placed by: Merri Ray CMA Duncan Dull),  March 16, 2010 10:28 AM Summary of Call: Called pt to see if she has turned in her hemoccults yet. L/M for pt to make sure she gets those turned in. call back as needed  Initial call taken by: Merri Ray CMA Duncan Dull),  March 16, 2010 10:28 AM

## 2010-07-04 NOTE — Letter (Signed)
Summary: CMA Hemoccult Letter  [facility]  [address] [place], [place] [address]   Phone: [phone]  Fax: [phone]         March 27, 2010 MRN: [id number]    [facility] [address] [place], [place]  [address]    Dear Ms. Gagen,     Despite repeated attempts, I have been unable to reach you by phone. At your last visit, Dr.Kaplan requested that you complete the hemoccult cards given to you at your last visit. However, as of yet, we have not received them. Please follow the instructions on the inside cover and return them as soon as possible.If you have misplaced the hemoccult cards, please call me at [phone] and I will mail you new cards. Your health is very important to [place].These tests will help ensure that Dr. Arlyce Dice has all the information at his disposal to make a complete diagnosis for you.  Thank you for your prompt attention to this matter.   Sincerely,    Merri Ray CMA (AAMA)

## 2010-07-04 NOTE — Assessment & Plan Note (Signed)
Summary: ACUTE/MADERA/1 MONTH F/U PER [facility]   Vital Signs:  Patient profile:   50 year old female Height:      66 inches (167.64 cm) Weight:      224.6 pounds (102.09 kg) BMI:     36.38 Temp:     97.7 degrees F (36.50 degrees C) oral Pulse rate:   73 / minute BP sitting:   145 / 86  (left arm)  Vitals Entered By: [name] RN (November 17, 2009 11:28 AM) Is Patient Diabetic? No Pain Assessment Patient in pain? yes     Location: back and left leg Intensity: 4 Type: aches Onset of pain  long time Nutritional Status BMI of 25 - 29 = overweight Nutritional Status Detail appetite good  Have you ever been in a relationship where you felt threatened, hurt or afraid?denies   Does patient need assistance? Functional Status Self care Ambulation Normal Comments FU - feeling better. Refills on meds and pain med.   Primary Care Provider:  Vassie Loll MD   History of Present Illness: Ms. Ehler comes for f/u visit.   1. Back pain: Her back pain is OK. She went to PT and she is taking her vicodin and she ran out of it.   2. Anemia; She never had a colonoscopy done. She is still having regular periods.   Depression History:      The patient denies a depressed mood most of the day and a diminished interest in her usual daily activities.         Preventive Screening-Counseling & Management  Alcohol-Tobacco     Smoking Status: quit  Caffeine-Diet-Exercise     Does Patient Exercise: no  Current Medications (verified): 1)  Alprazolam 1 Mg Tabs (Alprazolam) .... Take 1 Tablet By Mouth Upto 5 Times A Day 2)  Zolpidem Tartrate 10 Mg Tabs (Zolpidem Tartrate) .... Take 1 Tab By Mouth At Bedtime As Needed As Needed For Insomnia. 3)  Fluoxetine Hcl 40 Mg Caps (Fluoxetine Hcl) .... Take 1 Tablet By Mouth Once A Day 4)  Flexeril 10 Mg Tabs (Cyclobenzaprine Hcl) .Marland Kitchen.. 1 Tablet By Mouth Twice A Day. 5)  Omeprazole 20 Mg Cpdr (Omeprazole) .... Take 1 Capsule By Mouth Twice Daily. 6)   Vicodin 5-500 Mg Tabs (Hydrocodone-Acetaminophen) .... Take 1 Tablet Three Time A Day As Needed For Pain.  Allergies: No Known Drug Allergies  Review of Systems      See HPI  Physical Exam  Mouth:  pharynx pink and moist.   Lungs:  normal breath sounds, no crackles, and no wheezes.   Heart:  normal rate, regular rhythm, no murmur, and no gallop.   Extremities:  trace left pedal edema and trace right pedal edema.     Impression & Recommendations:  Problem # 1:  ANEMIA (ICD-285.9) COuld be menstrual loss, never had colonoscopy and MCV is 81. Will check anemia panel and if IDA confirmed, needs colonoscopy when she reaches 50.   Orders: T-Ferritin [phone]) T-Iron [phone]) T-Iron Binding Capacity (TIBC) ([id number]) T- * Misc. Laboratory test [id number]) T-Vitamin B12 [phone])  Problem # 2:  LOW BACK PAIN (ICD-724.2) Stable. Just started PT. Will f/u in a month.  Her updated medication list for this problem includes:    Flexeril 10 Mg Tabs (Cyclobenzaprine hcl) .Marland Kitchen... 1 tablet by mouth twice a day.    Vicodin 5-500 Mg Tabs (Hydrocodone-acetaminophen) .Marland Kitchen... Take 1 tablet three time a day as needed for pain.  Complete Medication List: 1)  Alprazolam 1  Mg Tabs (Alprazolam) .... Take 1 tablet by mouth upto 5 times a day 2)  Zolpidem Tartrate 10 Mg Tabs (Zolpidem tartrate) .... Take 1 tab by mouth at bedtime as needed as needed for insomnia. 3)  Fluoxetine Hcl 40 Mg Caps (Fluoxetine hcl) .... Take 1 tablet by mouth once a day 4)  Flexeril 10 Mg Tabs (Cyclobenzaprine hcl) .Marland Kitchen.. 1 tablet by mouth twice a day. 5)  Omeprazole 20 Mg Cpdr (Omeprazole) .... Take 1 capsule by mouth twice daily. 6)  Vicodin 5-500 Mg Tabs (Hydrocodone-acetaminophen) .... Take 1 tablet three time a day as needed for pain.  Patient Instructions: 1)  Please schedule a follow-up appointment in 1 month. 2)  Limit your Sodium (Salt) to less than 2 grams a day(slightly less than 1/2 a teaspoon) to prevent  fluid retention, swelling, or worsening of symptoms. 3)  It is important that you exercise regularly at least 20 minutes 5 times a week. If you develop chest pain, have severe difficulty breathing, or feel very tired , stop exercising immediately and seek medical attention. 4)  You need to lose weight. Consider a lower calorie diet and regular exercise.  Prescriptions: VICODIN 5-500 MG TABS (HYDROCODONE-ACETAMINOPHEN) Take 1 tablet three time a day as needed for pain.  #90 x 0   Entered and Authorized by:   Jason Coop MD   Signed by:   Jason Coop MD on 11/17/2009   Method used:   Print then Give to Patient   RxID:   [id number]  Process Orders Check Orders Results:     [facility]: ABN not required for this insurance Order queued for requisitioning for [facility]: November 17, 2009 11:36 AM  Tests Sent for requisitioning (November 17, 2009 11:36 AM):     11/17/2009: [facility] -- T-Ferritin [[id number]] (signed)     11/17/2009: [facility] -- Augusto Gamble [[id number]] (signed)     11/17/2009: [facility] -- T-Iron Binding Capacity (TIBC) [[id number]] (signed)     11/17/2009: [facility] -- T- * Misc. Laboratory test [[id number]] (signed)     11/17/2009: [facility] -- T-Vitamin B12 [id number] (signed)    Prevention & Chronic Care Immunizations   Influenza vaccine: Not documented   Influenza vaccine deferral: Deferred  (07/06/2009)    Tetanus booster: Not documented   Td booster deferral: Deferred  (07/06/2009)    Pneumococcal vaccine: Not documented   Pneumococcal vaccine deferral: Deferred  (07/06/2009)  Other Screening   Pap smear: Not documented   Pap smear action/deferral: Deferred  (07/06/2009)    Mammogram: Not documented   Mammogram action/deferral: Deferred  (07/06/2009)   Smoking status: quit  (11/17/2009)  Lipids   Total Cholesterol: 182  (07/06/2009)   LDL:  99  (07/06/2009)   LDL Direct: Not documented   HDL: 67  (07/06/2009)   Triglycerides: 80  (07/06/2009)  Hypertension   Last Blood Pressure: 145 / 86  (11/17/2009)   Serum creatinine: 0.77  (07/06/2009)   Serum potassium 4.7  (07/06/2009)  Self-Management Support :   Personal Goals (by the next clinic visit) :      Personal blood pressure goal: 140/90  (07/06/2009)   Patient will work on the following items until the next clinic visit to reach self-care goals:     Medications and monitoring: take my medicines every day, bring all of my medications to every visit, weigh myself weekly  (11/17/2009)     Eating: eat more vegetables, use fresh or frozen vegetables, eat  fruit for snacks and desserts, limit or avoid alcohol  (11/17/2009)     Activity: take a 30 minute walk every day, take the stairs instead of the elevator, park at the far end of the parking lot  (07/06/2009)    Hypertension self-management support: Written self-care plan, Education handout, Resources for patients handout  (11/17/2009)   Hypertension self-care plan printed.   Hypertension education handout printed      Resource handout printed.  Process Orders Check Orders Results:     [facility]: ABN not required for this insurance Order queued for requisitioning for [facility]: November 17, 2009 11:36 AM  Tests Sent for requisitioning (November 17, 2009 11:36 AM):     11/17/2009: [facility] -- T-Ferritin [[id number]] (signed)     11/17/2009: [facility] -- Augusto Gamble [[id number]] (signed)     11/17/2009: [facility] -- T-Iron Binding Capacity (TIBC) [[id number]] (signed)     11/17/2009: [facility] -- T- * Misc. Laboratory test [phone] (signed)     11/17/2009: [facility] -- T-Vitamin B12 [[id number]] (signed)    Appended Document: Orders Update    Clinical Lists Changes  Orders: Added new Service order of Est. Patient  Level III ([id number]) - Signed

## 2010-07-04 NOTE — Assessment & Plan Note (Signed)
Summary: acute-f/u with meds not working-([place]()/cfb   Vital Signs:  Patient profile:   50 year old female Height:      66 inches (167.64 cm) Temp:     98.1 degrees F (36.72 degrees C) oral Pulse rate:   90 / minute BP sitting:   146 / 91  (left arm)  Vitals Entered By: [name] RN (Oct 20, 2009 3:17 PM) Is Patient Diabetic? No Pain Assessment Patient in pain? yes     Location: Left groin and left leg Intensity: 5 Type: ? Onset of pain  long time Nutritional Status BMI of 25 - 29 = overweight Nutritional Status Detail appetite good  Have you ever been in a relationship where you felt threatened, hurt or afraid?denies   Does patient need assistance? Functional Status Self care Ambulation Normal Comments Discuss left leg pain and inc h/a with new med. Groin and left leg pain worse with movement.   Primary Care Provider:  Vassie Loll MD   History of Present Illness:  She is complaining of left hip, groin  pain radiate to her thigh back and , leg.  The pain started again 1 month ago. She denies trauma, almost fell the other day.  Pain is 9/10, dull ache, sometimes 5/10. She is using heating pacth. She is taking aleve, advil, tylenol. Tramadol doesnt help.  She is not feeling right after taking amitryptiline, she is having  headaches.  She  is following at behavioral health for her anxiety.     Dr Mitchell Heir was prescribing to her vicodin 5 /500, she has some left. She needs refill. She wont follow anymore with Dr Lorelle Gibbs due to economic reasons.   Depression History:      The patient denies a depressed mood most of the day and a diminished interest in her usual daily activities.         Preventive Screening-Counseling & Management  Alcohol-Tobacco     Smoking Status: quit  Caffeine-Diet-Exercise     Does Patient Exercise: no  Current Medications (verified): 1)  Alprazolam 1 Mg Tabs (Alprazolam) .... Take 1 Tablet By Mouth Upto 5 Times A Day 2)  Zolpidem  Tartrate 10 Mg Tabs (Zolpidem Tartrate) .... Take 1 Tab By Mouth At Bedtime As Needed As Needed For Insomnia. 3)  Fluoxetine Hcl 40 Mg Caps (Fluoxetine Hcl) .... Take 1 Tablet By Mouth Once A Day 4)  Flexeril 10 Mg Tabs (Cyclobenzaprine Hcl) .Marland Kitchen.. 1 Tablet By Mouth Twice A Day. 5)  Omeprazole 20 Mg Cpdr (Omeprazole) .... Take 1 Capsule By Mouth Twice Daily.  Allergies: No Known Drug Allergies  Review of Systems  The patient denies fever, chest pain, dyspnea on exertion, peripheral edema, prolonged cough, headaches, hemoptysis, abdominal pain, melena, hematochezia, and severe indigestion/heartburn.    Physical Exam  General:  alert, well-developed, and well-nourished.   Head:  normocephalic and atraumatic.   Lungs:  normal respiratory effort, no intercostal retractions, no accessory muscle use, and normal breath sounds.   Heart:  normal rate and regular rhythm.   Abdomen:  soft, non-tender, normal bowel sounds, and no distention.   Extremities:  no edema.  Neurologic:  alert & oriented X3.     Impression & Recommendations:  Problem # 1:  LOW BACK PAIN (ICD-724.2) She is complaining of left hip, and thigh pain, worse again since 1 month ago. I will give her refill for vicodin, case discussed with Dr. Phillips Odor.  Will check Urine drug screen. She will need to sign pain  contract next visit, she left without signing the pain contract, she was very anxious.  We will try physical therapy, she will need to understand that vicodin will be prescribe for a short period, until she improved with physical therapy.   Her updated medication list for this problem includes:    Flexeril 10 Mg Tabs (Cyclobenzaprine hcl) .Marland Kitchen... 1 tablet by mouth twice a day.    Vicodin 5-500 Mg Tabs (Hydrocodone-acetaminophen) .Marland Kitchen... Take 1 tablet three time a day as needed for pain.  Orders: T-Drug Screen-Urine, (single) [id number]) Physical Therapy Referral (PT)  Problem # 2:  HEMATOMA (ICD-924.9) She has couple of  bruise on her legs. I will check cbc. She doesnt remmember how it  happens.  Orders: T-CBC w/Diff ([id number])  Complete Medication List: 1)  Alprazolam 1 Mg Tabs (Alprazolam) .... Take 1 tablet by mouth upto 5 times a day 2)  Zolpidem Tartrate 10 Mg Tabs (Zolpidem tartrate) .... Take 1 tab by mouth at bedtime as needed as needed for insomnia. 3)  Fluoxetine Hcl 40 Mg Caps (Fluoxetine hcl) .... Take 1 tablet by mouth once a day 4)  Flexeril 10 Mg Tabs (Cyclobenzaprine hcl) .Marland Kitchen.. 1 tablet by mouth twice a day. 5)  Omeprazole 20 Mg Cpdr (Omeprazole) .... Take 1 capsule by mouth twice daily. 6)  Vicodin 5-500 Mg Tabs (Hydrocodone-acetaminophen) .... Take 1 tablet three time a day as needed for pain. Saw  Patient Instructions: 1)  Please schedule a follow-up appointment in 1 month. Prescriptions: VICODIN 5-500 MG TABS (HYDROCODONE-ACETAMINOPHEN) Take 1 tablet three time a day as needed for pain.  #90 x 0   Entered and Authorized by:   Hartley Barefoot MD   Signed by:   Hartley Barefoot MD on 10/20/2009   Method used:   Print then Give to Patient   RxID:   [id number]   Process Orders Check Orders Results:     [facility]: ABN not required for this insurance Tests Sent for requisitioning (Oct 21, 2009 8:29 AM):     10/20/2009: [facility] -- T-CBC w/Diff [[id number]] (signed)     10/20/2009: [facility] -- T-Drug Screen-Urine, (single) [[id number]] (signed)    Prevention & Chronic Care Immunizations   Influenza vaccine: Not documented   Influenza vaccine deferral: Deferred  (07/06/2009)    Tetanus booster: Not documented   Td booster deferral: Deferred  (07/06/2009)    Pneumococcal vaccine: Not documented   Pneumococcal vaccine deferral: Deferred  (07/06/2009)  Other Screening   Pap smear: Not documented   Pap smear action/deferral: Deferred  (07/06/2009)    Mammogram: Not documented   Mammogram action/deferral: Deferred   (07/06/2009)   Smoking status: quit  (10/20/2009)  Lipids   Total Cholesterol: 182  (07/06/2009)   LDL: 99  (07/06/2009)   LDL Direct: Not documented   HDL: 67  (07/06/2009)   Triglycerides: 80  (07/06/2009)  Hypertension   Last Blood Pressure: 146 / 91  (10/20/2009)   Serum creatinine: 0.77  (07/06/2009)   Serum potassium 4.7  (07/06/2009)  Self-Management Support :   Personal Goals (by the next clinic visit) :      Personal blood pressure goal: 140/90  (07/06/2009)   Patient will work on the following items until the next clinic visit to reach self-care goals:     Medications and monitoring: take my medicines every day, bring all of my medications to every visit, weigh myself weekly  (10/20/2009)     Eating: eat more vegetables, use fresh  or frozen vegetables, eat baked foods instead of fried foods, eat fruit for snacks and desserts, limit or avoid alcohol  (10/20/2009)     Activity: take a 30 minute walk every day, take the stairs instead of the elevator, park at the far end of the parking lot  (07/06/2009)    Hypertension self-management support: Written self-care plan, Education handout, Resources for patients handout  (10/20/2009)   Hypertension self-care plan printed.   Hypertension education handout printed      Resource handout printed.

## 2010-07-04 NOTE — Letter (Signed)
Summary: New Patient letter  [facility]  [address] [place], [place] [address]   Phone: [phone]  Fax: [phone]       12/02/2009 MRN: [id number]  [facility] [address] [place], [place]  [address]  Dear Ms. Coletta,  Welcome to the Gastroenterology Division at [facility].    You are scheduled to see Dr.  Arlyce Dice on 01-10-10 at 1:30p.m. on the 3rd floor at [facility], [address]  We ask that you try to arrive at our office 15 minutes prior to your appointment time to allow for check-in.  We would like you to complete the enclosed self-administered evaluation form prior to your visit and bring it with you on the day of your appointment.  We will review it with you.  Also, please bring a complete list of all your medications or, if you prefer, bring the medication bottles and we will list them.  Please bring your insurance card so that we may make a copy of it.  If your insurance requires a referral to see a specialist, please bring your referral form from your primary care physician.  Co-payments are due at the time of your visit and may be paid by cash, check or credit card.     Your office visit will consist of a consult with your physician (includes a physical exam), any laboratory testing he/she may order, scheduling of any necessary diagnostic testing (e.g. x-ray, ultrasound, CT-scan), and scheduling of a procedure (e.g. Endoscopy, Colonoscopy) if required.  Please allow enough time on your schedule to allow for any/all of these possibilities.    If you cannot keep your appointment, please call [phone] to cancel or reschedule prior to your appointment date.  This allows [place] the opportunity to schedule an appointment for another patient in need of care.  If you do not cancel or reschedule by 5 p.m. the business day prior to your appointment date, you will be charged a $50.00 late cancellation/no-show fee.    Thank you for choosing [facility] for your medical needs.  We appreciate the opportunity to care for you.  Please visit [place] at our website  to learn more about our practice.                     Sincerely,                                                             The Gastroenterology Division

## 2010-07-04 NOTE — Miscellaneous (Signed)
Summary: [facility]  [facility]   Imported By: Shon Hough 12/20/2009 16:10:38  _____________________________________________________________________  External Attachment:    Type:   Image     Comment:   External Document

## 2010-07-04 NOTE — Progress Notes (Signed)
Summary: Sample MoviPrep Kit  Phone Note Outgoing Call Call back at [facility] Phone [phone]   Call placed by: Merri Ray CMA [facility]),  January 11, 2010 9:53 AM Summary of Call: recieved call from [facility] that MoviPrep will not be covered, Called pt and left message that I have a sample kit here for her. Initial call taken by: Merri Ray CMA [facility]),  January 11, 2010 9:54 AM

## 2010-07-04 NOTE — Assessment & Plan Note (Signed)
Summary: ACUTE-MEDICATION REFILLS/CFB([place])   Vital Signs:  Patient profile:   50 year old female Height:      66 inches (167.64 cm) Weight:      223.0 pounds (101.36 kg) BMI:     36.12 Temp:     97.5 degrees F (36.39 degrees C) oral Pulse rate:   82 / minute BP sitting:   155 / 90  (right arm)  Vitals Entered By: Theotis Barrio NT II (December 14, 2009 4:00 PM) CC: PATIENT IS HERE FOR MEDICATION REFILL Is Patient Diabetic? No Pain Assessment Patient in pain? yes     Location: lower back Intensity:       3 Onset of pain  Chronic Nutritional Status BMI of > 30 = obese  Have you ever been in a relationship where you felt threatened, hurt or afraid?No   Does patient need assistance? Functional Status Self care Ambulation Normal Comments PATIENT IS HERE FOR MEDICATION REFILL   Primary Care Provider:  Vassie Loll MD  CC:  PATIENT IS HERE FOR MEDICATION REFILL.  History of Present Illness: ms Beam is her today to refill her vicodin which she takes for her low back pain . She alreday has a pain contract with [place]. She also asked for a refill prescription as she might not be able to come to clinic just after colonoscopy and the scripts usually runs out  She will get her colonoscopy next month and I asked her make an appointment after that is done.  Preventive Screening-Counseling & Management  Alcohol-Tobacco     Smoking Status: quit  Caffeine-Diet-Exercise     Does Patient Exercise: no  Problems Prior to Update: 1)  Hemoccult Positive Stool  (ICD-578.1) 2)  Unspecified Unsatisfactory Restoration of Tooth  (ICD-525.60) 3)  Anemia  (ICD-285.9) 4)  Hematoma  (ICD-924.9) 5)  Restless Legs Syndrome  (ICD-333.94) 6)  Low Back Pain  (ICD-724.2) 7)  Hypertension  (ICD-401.9) 8)  Depression  (ICD-311) 9)  Anxiety  (ICD-300.00) 10)  Morbid Obesity  (ICD-278.01) 11)  Depression/anxiety  (ICD-300.4) 12)  Essential Hypertension, Benign  (ICD-401.1) 13)  Back Pain With  Radiculopathy  (ICD-729.2) 14)  Gerd  (ICD-530.81)  Medications Prior to Update: 1)  Alprazolam 1 Mg Tabs (Alprazolam) .... Take 1 Tablet By Mouth Upto 5 Times A Day 2)  Zolpidem Tartrate 10 Mg Tabs (Zolpidem Tartrate) .... Take 1 Tab By Mouth At Bedtime As Needed As Needed For Insomnia. 3)  Fluoxetine Hcl 40 Mg Caps (Fluoxetine Hcl) .... Take 1 Tablet By Mouth Once A Day 4)  Flexeril 10 Mg Tabs (Cyclobenzaprine Hcl) .Marland Kitchen.. 1 Tablet By Mouth Twice A Day. 5)  Omeprazole 20 Mg Cpdr (Omeprazole) .... Take 1 Capsule By Mouth Twice Daily. 6)  Vicodin 5-500 Mg Tabs (Hydrocodone-Acetaminophen) .... Take 1 Tablet Three Time A Day As Needed For Pain. 7)  Ferrous Sulfate 325 (65 Fe) Mg Tbec (Ferrous Sulfate) .... Take 1 Pill By Mouth Three Times A Day After Meal.  Current Medications (verified): 1)  Alprazolam 1 Mg Tabs (Alprazolam) .... Take 1 Tablet By Mouth Upto 5 Times A Day 2)  Zolpidem Tartrate 10 Mg Tabs (Zolpidem Tartrate) .... Take 1 Tab By Mouth At Bedtime As Needed As Needed For Insomnia. 3)  Fluoxetine Hcl 40 Mg Caps (Fluoxetine Hcl) .... Take 1 Tablet By Mouth Once A Day 4)  Flexeril 10 Mg Tabs (Cyclobenzaprine Hcl) .Marland Kitchen.. 1 Tablet By Mouth Twice A Day. 5)  Omeprazole 20 Mg Cpdr (Omeprazole) .... Take 1 Capsule  By Mouth Twice Daily. 6)  Vicodin 5-500 Mg Tabs (Hydrocodone-Acetaminophen) .... Take 1 Tablet Three Time A Day As Needed For Pain. 7)  Ferrous Sulfate 325 (65 Fe) Mg Tbec (Ferrous Sulfate) .... Take 1 Pill By Mouth Three Times A Day After Meal.  Allergies (verified): No Known Drug Allergies  Past History:  Family History: Last updated: 07/06/2009 Family hx significant for diabetes and HTN.  Social History: Last updated: 07/06/2009 Married Alcohol use-no Drug use-no Regular exercise-no  Risk Factors: Exercise: no (12/14/2009)  Risk Factors: Smoking Status: quit (12/14/2009)  Review of Systems      See HPI  Physical Exam  Additional Exam:  Gen: AOx3, in no acute  distress Eyes: PERRL, EOMI ENT:MMM, No erythema noted in posterior pharynx Neck: No JVD, No LAP Chest: CTAB with  good respiratory effort CVS: regular rhythmic rate, NO M/R/G, S1 S2 normal Abdo: soft,ND, BS+x4, Non tender and No hepatosplenomegaly EXT: No odema noted Neuro: Non focal, gait is normal Skin: no rashes noted.    Impression & Recommendations:  Problem # 1:  HEMOCCULT POSITIVE STOOL (ICD-578.1) Assessment Comment Only Continue oral ion therapy. Follow up after colonoscopy.  Problem # 2:  LOW BACK PAIN (ICD-724.2) Assessment: Unchanged efill pain meds today with a refill prescription to be refilled after 16th of next month. Her updated medication list for this problem includes:    Flexeril 10 Mg Tabs (Cyclobenzaprine hcl) .Marland Kitchen... 1 tablet by mouth twice a day.    Vicodin 5-500 Mg Tabs (Hydrocodone-acetaminophen) .Marland Kitchen... Take 1 tablet three time a day as needed for pain.  Discussed use of moist heat or ice, modified activities, medications, and stretching/strengthening exercises. Back care instructions given. To be seen in 2 weeks if no improvement; sooner if worsening of symptoms.   Problem # 3:  HYPERTENSION (ICD-401.9) Assessment: Deteriorated Is in apin as she ran out of her vicodin hich will explain her high BP. I will recheck in next visit. BP today: 155/90 Prior BP: 145/86 (11/17/2009)  Labs Reviewed: K+: 4.7 (07/06/2009) Creat: : 0.77 (07/06/2009)   Chol: 182 (07/06/2009)   HDL: 67 (07/06/2009)   LDL: 99 (07/06/2009)   TG: 80 (07/06/2009)  Problem # 4:  Preventive Health Care (ICD-V70.0) Assessment: Comment Only Mammogram today.  Complete Medication List: 1)  Alprazolam 1 Mg Tabs (Alprazolam) .... Take 1 tablet by mouth upto 5 times a day 2)  Zolpidem Tartrate 10 Mg Tabs (Zolpidem tartrate) .... Take 1 tab by mouth at bedtime as needed as needed for insomnia. 3)  Fluoxetine Hcl 40 Mg Caps (Fluoxetine hcl) .... Take 1 tablet by mouth once a day 4)  Flexeril  10 Mg Tabs (Cyclobenzaprine hcl) .Marland Kitchen.. 1 tablet by mouth twice a day. 5)  Omeprazole 20 Mg Cpdr (Omeprazole) .... Take 1 capsule by mouth twice daily. 6)  Vicodin 5-500 Mg Tabs (Hydrocodone-acetaminophen) .... Take 1 tablet three time a day as needed for pain. 7)  Ferrous Sulfate 325 (65 Fe) Mg Tbec (Ferrous sulfate) .... Take 1 pill by mouth three times a day after meal.  Other Orders: Mammogram (Screening) (Mammo)  Patient Instructions: 1)  Please schedule a follow-up appointment in 1 month with your regular doctor after your colonoscopy. 2)  It is important that you exercise regularly at least 20 minutes 5 times a week. If you develop chest pain, have severe difficulty breathing, or feel very tired , stop exercising immediately and seek medical attention. 3)  You need to lose weight. Consider a lower calorie diet and  regular exercise.  4)  Check your Blood Pressure regularly. If it is above: 140/90you should make an appointment. Prescriptions: VICODIN 5-500 MG TABS (HYDROCODONE-ACETAMINOPHEN) Take 1 tablet three time a day as needed for pain.  #90 x 0   Entered and Authorized by:   Lars Mage MD   Signed by:   Lars Mage MD on 12/15/2009   Method used:   Print then Give to Patient   RxID:   [id number]    Prevention & Chronic Care Immunizations   Influenza vaccine: Not documented   Influenza vaccine deferral: Deferred  (12/14/2009)    Tetanus booster: Not documented   Td booster deferral: Deferred  (12/14/2009)    Pneumococcal vaccine: Not documented   Pneumococcal vaccine deferral: Deferred  (07/06/2009)  Other Screening   Pap smear: Not documented   Pap smear action/deferral: Deferred  (12/14/2009)    Mammogram: Not documented   Mammogram action/deferral: Ordered  (12/14/2009)   Smoking status: quit  (12/14/2009)  Lipids   Total Cholesterol: 182  (07/06/2009)   LDL: 99  (07/06/2009)   LDL Direct: Not documented   HDL: 67  (07/06/2009)   Triglycerides: 80   (07/06/2009)  Hypertension   Last Blood Pressure: 155 / 90  (12/14/2009)   Serum creatinine: 0.77  (07/06/2009)   Serum potassium 4.7  (07/06/2009)    Hypertension flowsheet reviewed?: Yes   Progress toward BP goal: Deteriorated  Self-Management Support :   Personal Goals (by the next clinic visit) :      Personal blood pressure goal: 140/90  (07/06/2009)   Patient will work on the following items until the next clinic visit to reach self-care goals:     Medications and monitoring: take my medicines every day, bring all of my medications to every visit  (12/14/2009)     Eating: drink diet soda or water instead of juice or soda, eat more vegetables, use fresh or frozen vegetables, eat foods that are low in salt, eat baked foods instead of fried foods, eat fruit for snacks and desserts, limit or avoid alcohol  (12/14/2009)     Activity: take a 30 minute walk every day, take the stairs instead of the elevator, park at the far end of the parking lot  (07/06/2009)    Hypertension self-management support: Resources for patients handout, Written self-care plan  (12/14/2009)   Hypertension self-care plan printed.      Resource handout printed.   Nursing Instructions: Schedule screening mammogram (see order)

## 2010-07-04 NOTE — Letter (Signed)
Summary: EGD Instructions  [facility]  [address] [place], [place] [address]   Phone: [phone]  Fax: [phone]       [facility]    10-27-1960    MRN: [id number]       Procedure Day /Date:  Thursday 02/23/2010     Arrival Time:  7:30 am     Procedure Time: 8:00 am     Location of Procedure:                    _x_ [facility] (4th Floor)    PREPARATION FOR ENDOSCOPY   On Thursday 9/22 THE DAY OF THE PROCEDURE:  1.   No solid foods, milk or milk products are allowed after midnight the night before your procedure.  2.   Do not drink anything colored red or purple.  Avoid juices with pulp.  No orange juice.  3.  You may drink clear liquids until 6:00 am, which is 2 hours before your procedure.                                                                                                CLEAR LIQUIDS INCLUDE: Water Jello Ice Popsicles Tea (sugar ok, no milk/cream) Powdered fruit flavored drinks Coffee (sugar ok, no milk/cream) Gatorade Juice: apple, white grape, white cranberry  Lemonade Clear bullion, consomm, broth Carbonated beverages (any kind) Strained chicken noodle soup Hard Candy   MEDICATION INSTRUCTIONS  Unless otherwise instructed, you should take regular prescription medications with a small sip of water as early as possible the morning of your procedure.              OTHER INSTRUCTIONS  You will need a responsible adult at least 51 years of age to accompany you and drive you home.   This person must remain in the waiting room during your procedure.  Wear loose fitting clothing that is easily removed.  Leave jewelry and other valuables at home.  However, you may wish to bring a book to read or an iPod/MP3 player to listen to music as you wait for your procedure to start.  Remove all body piercing jewelry and leave at home.  Total time from sign-in until discharge is approximately 2-3 hours.  You should  go home directly after your procedure and rest.  You can resume normal activities the day after your procedure.  The day of your procedure you should not:   Drive   Make legal decisions   Operate machinery   Drink alcohol   Return to work  You will receive specific instructions about eating, activities and medications before you leave.    The above instructions have been reviewed and explained to me by   Durwin Glaze RN  February 17, 2010 3:59 PM     I fully understand and can verbalize these instructions _____________________________ Date _________

## 2010-07-04 NOTE — Progress Notes (Signed)
Summary: Endoscopy Scheduled  Phone Note Outgoing Call Call back at [facility] Phone [phone]   Call placed by: Laureen Ochs LPN,  February 16, 2010 9:42 AM Call placed to: Patient Summary of Call: Per Colonoscopy report from 02-14-10, pt. needs to be scheduled for an Endoscopy. Message left for patient to callback.  Initial call taken by: Laureen Ochs LPN,  February 16, 2010 9:43 AM  Follow-up for Phone Call        Pt. has scheduled her previsit for 02-17-10 at 3:30pm and her Endoscopy in [facility] on 02-23-10 at 8am. Pt. instructed to call back as needed.  Follow-up by: Laureen Ochs LPN,  February 16, 2010 3:45 PM

## 2010-07-04 NOTE — Assessment & Plan Note (Signed)
Summary: NEW TO CLINIC NO REOCRDS AVA/CFB   Vital Signs:  Patient profile:   50 year old female Height:      66 inches (167.64 cm) Weight:      236.1 pounds (107.32 kg) BMI:     38.25 Temp:     97.5 degrees F (36.39 degrees C) oral Pulse rate:   73 / minute BP sitting:   164 / 93  (right arm)  Vitals Entered By: [name] RN (July 06, 2009 11:14 AM) Is Patient Diabetic? No Pain Assessment Patient in pain? no      Nutritional Status BMI of 25 - 29 = overweight Nutritional Status Detail appetite good  Have you ever been in a relationship where you felt threatened, hurt or afraid?denies   Does patient need assistance? Functional Status Self care Ambulation Normal Comments New pt - no insurance. Needs to see Rudell Cobb. Gets meds at [facility] and [facility].Has been seeing Dr Lovell Sheehan in [place] - Since "98 pain in back and both legs Dry mouth and both sides neck tender past 2-3 months.   History of Present Illness: 50 y/o female with pmh significant for GERD, insomnia, depresion, anxiety, chronic back pain, HTN and obesity. Who came to the clinic as a new patient in order to established relation with a primary care physician. Pt is [facility] and receiving treatment for her depression and also her anxiety.  Patient is currently having lower back pain, radiated down her legs (bilaterally), associated with muscle spasm and rate to be 10/10 at its worse; no numbness associated. Patient also reports having restless leg syndrome. Patient reports a lot of fatigue, no energy, tire all the time and with decreased interest in hobbies and activities.  Patient denies fever, chills, dysuria, abdominal pain, nausea, vomiting, CP, SOB, blurred vsion, HA's or any other complaints.  BP today was 164/93; A1C 5.8  Depression History:      The patient denies a depressed mood most of the day and a diminished interest in her usual daily  activities.        The patient denies that she feels like life is not worth living, denies that she wishes that she were dead, and denies that she has thought about ending her life.         Preventive Screening-Counseling & Management  Alcohol-Tobacco     Smoking Status: quit  Caffeine-Diet-Exercise     Does Patient Exercise: no      Drug Use:  no.    Current Problems (verified): 1)  Morbid Obesity  (ICD-278.01) 2)  Depression/anxiety  (ICD-300.4) 3)  Essential Hypertension, Benign  (ICD-401.1) 4)  Back Pain With Radiculopathy  (ICD-729.2) 5)  Gerd  (ICD-530.81)  Current Medications (verified): 1)  Alprazolam 1 Mg Tabs (Alprazolam) .... Take 1 Tablet By Mouth Upto 5 Times A Day 2)  Zolpidem Tartrate 10 Mg Tabs (Zolpidem Tartrate) .... Take 1 Tab By Mouth At Bedtime As Needed As Needed For Insomnia. 3)  Fluoxetine Hcl 40 Mg Caps (Fluoxetine Hcl) .... Take 1 Tablet By Mouth Once A Day 4)  Vicodin 5-500 Mg Tabs (Hydrocodone-Acetaminophen) .... Take 1 Tablet By Mouth Every 8 Hours As Neeed For Pain. 5)  Flexeril 10 Mg Tabs (Cyclobenzaprine Hcl) .Marland Kitchen.. 1 Tablet By Mouth Twice A Day. 6)  Omeprazole 20 Mg Cpdr (Omeprazole) .... Take 1 Capsule By Mouth Once A Day  Allergies (verified): No Known Drug Allergies  Family History: Family hx  significant for diabetes and HTN.  Social History: Married Alcohol use-no Drug use-no Regular exercise-no Smoking Status:  quit Drug Use:  no Does Patient Exercise:  no  Review of Systems       As per HPI.  Physical Exam  General:  alert, well-developed, well-hydrated, and overweight-appearing.   Head:  normocephalic and atraumatic.   Eyes:  vision grossly intact, EOMI, PERRLA, wearing corrective glasses..   Mouth:  pharynx pink and moist, no erythema, no exudates, and fair dentition.   Neck:  supple and full ROM.   Lungs:  Normal respiratory effort, chest expands symmetrically. Lungs are clear to auscultation, no crackles or  wheezes. Heart:  Normal rate and regular rhythm. S1 and S2 normal without gallop, murmur, click, rub or other extra sounds. Abdomen:  Bowel sounds positive,abdomen soft and non-tender without masses, organomegaly or hernias noted. Msk:  no joint swelling, no joint warmth, no joint deformities, and no crepitation. Tenderness to palpation over middle lumbar area and also bilateral hips (more L than R). Positive RLT. Extremities:  No edema, cyanosis or clubbing. Neurologic:  alert & oriented X3, cranial nerves II-XII intact, strength normal in all extremities, sensation intact to light touch, and gait normal.   Psych:  not suicidal, not homicidal, depressed affect, flat affect, poor eye contact, and slightly anxious.     Impression & Recommendations:  Problem # 1:  DEPRESSION/ANXIETY (ICD-300.4) Patient with severe depression, anxiety and social fobia. She is currently follow by Dr[facility] Elmon [facility]schner [facility] and is using prozac, alprazolam and zolpidem as needed. She is also receiving psycotherapy. Patient reports that she has used (cymbalta, celexa and zoloft) and prozac is the only one that had helped her. She might benefit of abilify or low dose amitriptylene to be added to her regimen; will discussed that possibility with her psychiatrist. Meanwhile she will continue current regimen. Will check TSH levels today.  Problem # 2:  ESSENTIAL HYPERTENSION, BENIGN (ICD-401.1) Patient new to clinic with elevated BP. Before starting any medications; will stratify patient with CBC, TSH, CMET, lipid profile and A1C. Patient has been advised to follow a low sodium diet and also to exercise and to lose weight. Will reasses her BP in 2 weeks, if still elevated will start tx with HCTZ and LISINOPRIL.  Orders: T-Comprehensive Metabolic Panel [id number]) T-CBC w/Diff [id number]) T-TSH [id number]) T-Lipid Profile ([id number])  Problem # 3:  LOW BACK PAIN (ICD-724.2) Over 2 years suffering of low back pain  and bilateral hip pain. X-ray negative except for mild muscular atrophy changes and rethrolistesis of L3 on L4. Patient labs are WNL, including CBC, ESR, RF, TSH and B12. Since there is no red flag symptoms like: weight loss, weakness, numbness, urine or fecal incontinence, fever, or labs abnormality will hold on MRI study and will treat conservative with flexeril, diclofenac two times a day and physical therapy . Patient is going to start the diclofenac in 2 weeks during followup and meanwhile will finish her vicodin she uses as needed as prescribed by prior pcp.  Her updated medication list for this problem includes:    Vicodin 5-500 Mg Tabs (Hydrocodone-acetaminophen) .Marland Kitchen... Take 1 tablet by mouth every 8 hours as neeed for pain.    Flexeril 10 Mg Tabs (Cyclobenzaprine hcl) .Marland Kitchen... 1 tablet by mouth twice a day.  Problem # 4:  MORBID OBESITY (ICD-278.01) A1C was checked for diabetes screening and was normal. Patient has been advised to follow a low calorie diet, to watch her meal portions and  to do exercises.    Orders: T-Hgb A1C (in-house) ([id number]) Diagnostic X-Ray/Fluoroscopy (Diagnostic X-Ray/Flu)  Problem # 5:  GERD (ICD-530.81) Will continue using omeprazole, but will increased change it to twice a day. she will also follow recommendation for life style modifications. Will follow on her symptoms.  Her updated medication list for this problem includes:    Omeprazole 20 Mg Cpdr (Omeprazole) .Marland Kitchen... Take 1 capsule by mouth twice daily.  Complete Medication List: 1)  Alprazolam 1 Mg Tabs (Alprazolam) .... Take 1 tablet by mouth upto 5 times a day 2)  Zolpidem Tartrate 10 Mg Tabs (Zolpidem tartrate) .... Take 1 tab by mouth at bedtime as needed as needed for insomnia. 3)  Fluoxetine Hcl 40 Mg Caps (Fluoxetine hcl) .... Take 1 tablet by mouth once a day 4)  Vicodin 5-500 Mg Tabs (Hydrocodone-acetaminophen) .... Take 1 tablet by mouth every 8 hours as neeed for pain. 5)  Flexeril 10 Mg Tabs  (Cyclobenzaprine hcl) .Marland Kitchen.. 1 tablet by mouth twice a day. 6)  Omeprazole 20 Mg Cpdr (Omeprazole) .... Take 1 capsule by mouth twice daily.  Other Orders: T-Rheumatoid Factor [id number]) T-Reticulocyte Count, Manual ([id number]) T-Sed Rate (Automated) [id number]) T-Vitamin B12 ([id number])  Patient Instructions: 1)  Please schedule a follow-up appointment in 2 weeks with me. 2)  Most patients (90%) with low back pain will improve with time (2-6 weeks). Keep active but avoid activities that are painful. Apply moist heat and/or ice to lower back several times a day. ok to use pain medication as prescribed. 3)  You will be called with any abnormalities in the tests scheduled or performed today.  If you don't hear from [place] within a week from when the test was performed, you can assume that your test was normal. 4)  Remember to start taking your omeprazole two times a day and also to follow reflux prevention instruction. 5)  Avoid foods high in acid (tomatoes, citrus juices, spicy foods). Avoid eating within two hours of lying down or before exercising. Do not over eat; try smaller more frequent meals. Elevate head of bed twelve inches when sleeping. 6)  It is important that you exercise regularly at least 20 minutes 5 times a week. 7)  You need to lose weight. Consider a lower calorie diet and regular exercise.  8)  Follow a low sodium diet. (less than 2 gram daily). Process Orders Check Orders Results:     [facility]: ABN not required for this insurance Tests Sent for requisitioning (July 07, 2009 10:42 PM):     07/06/2009: [facility] -- T-Comprehensive Metabolic Panel [[id number]] (signed)     07/06/2009: [facility] -- T-CBC w/Diff [[id number]] (signed)     07/06/2009: [facility] -- T-TSH [id number] (signed)     07/06/2009: [facility] -- T-Lipid Profile [id number] (signed)     07/06/2009: [facility] -- T-Rheumatoid Factor [id number] (signed)     07/06/2009: [facility] -- T-Reticulocyte Count, Manual [[id number]] (signed)     07/06/2009: [facility] -- T-Sed Rate (Automated) [id number] (signed)     07/06/2009: [facility] -- T-Vitamin B12 [[id number]] (signed)    Prevention & Chronic Care Immunizations   Influenza vaccine: Not documented   Influenza vaccine deferral: Deferred  (07/06/2009)    Tetanus booster: Not documented   Td booster deferral: Deferred  (07/06/2009)    Pneumococcal vaccine: Not documented   Pneumococcal vaccine deferral: Deferred  (07/06/2009)  Other Screening   Pap  smear: Not documented   Pap smear action/deferral: Deferred  (07/06/2009)    Mammogram: Not documented   Mammogram action/deferral: Deferred  (07/06/2009)   Smoking status: quit  (07/06/2009)  Lipids   Total Cholesterol: Not documented   LDL: Not documented   LDL Direct: Not documented   HDL: Not documented   Triglycerides: Not documented  Hypertension   Last Blood Pressure: 164 / 93  (07/06/2009)   Serum creatinine: Not documented   Serum potassium Not documented CMP ordered     Hypertension flowsheet reviewed?: Yes  Self-Management Support :   Personal Goals (by the next clinic visit) :      Personal blood pressure goal: 140/90  (07/06/2009)   Patient will work on the following items until the next clinic visit to reach self-care goals:     Medications and monitoring: take my medicines every day, check my blood pressure, bring all of my medications to every visit  (07/06/2009)     Eating: drink diet soda or water instead of juice or soda, eat more vegetables, use fresh or frozen vegetables, eat foods that are low in salt, eat baked foods instead of fried foods, limit or avoid alcohol  (07/06/2009)     Activity: take a 30 minute walk every day, take the stairs instead of the elevator, park at the far end of the  parking lot  (07/06/2009)    Hypertension self-management support: Written self-care plan  (07/06/2009)   Hypertension self-care plan printed.  Laboratory Results   Blood Tests   Date/Time Received: July 06, 2009 12:44 PM Date/Time Reported: Alric Quan  July 06, 2009 12:44 PM   HGBA1C: 5.8%   (Normal Range: Non-Diabetic - 3-6%   Control Diabetic - 6-8%)

## 2010-07-04 NOTE — Assessment & Plan Note (Signed)
Summary: 2WK F/U/VS   Vital Signs:  Patient profile:   50 year old female Height:      66 inches (167.64 cm) Weight:      232.1 pounds (105.50 kg) BMI:     37.60 Temp:     97.4 degrees F (36.33 degrees C) oral Pulse rate:   79 / minute BP sitting:   120 / 78  (right arm)  Vitals Entered By: Dorie Rank RN (July 22, 2009 11:04 AM) Is Patient Diabetic? No Pain Assessment Patient in pain? yes     Location: h/a Intensity: 5 Type: aching Onset of pain  this AM Nutritional Status BMI of > 30 = obese Nutritional Status Detail appetite good  Have you ever been in a relationship where you felt threatened, hurt or afraid?denies   Does patient need assistance? Functional Status Self care Ambulation Normal Comments FU - discuss labs and x-rays.   History of Present Illness: 50 y/o female with pmh significant for GERD, insomnia, depresion, anxiety, chronic back pain, ? HTN and obesity. Who came to the clinic for followup of her BP and to discuss results of her labs and also x-ray done during previous visit.  Patient reports that after using the diclofenac as prescribed her back has improved and the pain is much better. She also endorses that after changing her omeprazole to twice a day she is feeling much better and denies any reflux or indigestion.  Patient reports still experiencing a lot of fatigue, no energy, tireness all the time and with decreased interest in hobbies and activities. She also reports insomnia due to her leg discomfort at night;she described unpleasant paresthesias, jerking legs movements at night and urge to move her legs (which relieved the sensation, but also kep herslef awake).  Patient denies fever, chills, dysuria, abdominal pain, nausea, vomiting, CP, SOB, blurred vsion, HA's or any other complaints.  BP today was 120/78.  Depression History:      The patient denies a depressed mood most of the day and a diminished interest in her usual daily  activities.        The patient denies that she feels like life is not worth living, denies that she wishes that she were dead, and denies that she has thought about ending her life.         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 Take 1 Tablet By Mouth Upto 5 Times A Day 2)  Zolpidem Tartrate 10 Mg Tabs (Zolpidem Tartrate) .... Take 1 Tab By Mouth At Bedtime As Needed As Needed For Insomnia. 3)  Fluoxetine Hcl 40 Mg Caps (Fluoxetine  Hcl) .... Take 1 Tablet By Mouth Once A Day 4)  Vicodin 5-500 Mg Tabs (Hydrocodone-Acetaminophen) .... Take 1 Tablet By Mouth Every 8 Hours As Neeed For Pain. 5)  Flexeril 10 Mg Tabs (Cyclobenzaprine Hcl) .Marland Kitchen.. 1 Tablet By Mouth Twice A Day. 6)  Omeprazole 20 Mg Cpdr (Omeprazole) .... Take 1 Capsule By Mouth Twice Daily.  Current Medications (verified): 1)  Alprazolam 1 Mg Tabs (Alprazolam) .... Take 1 Tablet By Mouth Upto 5 Times A Day 2)  Zolpidem Tartrate 10 Mg Tabs (Zolpidem Tartrate) .... Take 1 Tab By Mouth At Bedtime As Needed As Needed For Insomnia. 3)  Fluoxetine Hcl 40 Mg Caps  (Fluoxetine Hcl) .... Take 1 Tablet By Mouth Once A Day 4)  Flexeril 10 Mg Tabs (Cyclobenzaprine Hcl) .Marland Kitchen.. 1 Tablet By Mouth Twice A Day. 5)  Omeprazole 20 Mg Cpdr (Omeprazole) .... Take 1 Capsule By Mouth Twice Daily.  Allergies (verified): No Known Drug Allergies  Past History:  Family History: Last updated: 07/06/2009 Family hx significant for diabetes and HTN.  Social History: Last updated: 07/06/2009 Married Alcohol use-no Drug use-no Regular exercise-no  Risk Factors: Exercise: no (07/22/2009)  Risk Factors: Smoking Status: quit (07/22/2009)  Review of Systems       As per HPI.  Physical Exam  General:  alert, well-developed, well-hydrated, and overweight-appearing.   Lungs:  Normal respiratory effort, chest expands symmetrically. Lungs are clear to auscultation, no crackles or wheezes. Heart:  Normal rate and regular rhythm. S1 and S2 normal without gallop, murmur, click, rub or other extra sounds. Abdomen:  Bowel sounds positive,abdomen soft and non-tender without masses, organomegaly or hernias noted. Extremities:  No edema, no cyanosis. Neurologic:  alert & oriented X3, cranial nerves II-XII intact, strength normal in all extremities, sensation intact to light touch, and gait normal.     Impression & Recommendations:  Problem # 1:  RESTLESS LEGS SYNDROME (ICD-333.94) Symptoms and history consistently with restless leg syndrome. will start treatment with amitripitylene 25mg  at bedtime. TSH and B12 levels are normal.  Problem # 2:  LOW BACK PAIN (ICD-724.2) Improved after using diclofenac; she is now not taking vicodin anymore and reports that flexeril is what really help controlling her pain. X-rays were negative for acute findings. Since she was complaining of pain in other multiple places, this could be all secondary to chronic pain syndrome. will start amitriptylene and will also has patient lose weight and start exercising.  The following medications were  removed from the medication list:    Vicodin 5-500 Mg Tabs (Hydrocodone-acetaminophen) .Marland Kitchen... Take 1 tablet by mouth every 8 hours as neeed for pain. Her updated medication list for this problem includes:    Flexeril 10 Mg Tabs (Cyclobenzaprine hcl) .Marland Kitchen... 1 tablet by mouth twice a day.  Problem # 3:  DEPRESSION/ANXIETY (ICD-300.4) Patient will continue current therapy and also followup with mental health. Amitriptylene initiated for restless leg syndrome, will also help her depression and chronic pain. Patient encourage to continue psychotherapy as well.  Problem # 4:  GERD (ICD-530.81) Doing great and well controlled with omeprazole two times a day. will refresh lifestyle modification changes recommendations and will continue current regimen.  Her updated medication list for this problem includes:    Omeprazole 20 Mg Cpdr (Omeprazole) .Marland Kitchen... Take 1 capsule by mouth twice daily.  Problem # 5:  HYPERTENSION (ICD-401.9) Patient BP is at goal now. Will continue monitoring w/o starting any medications yet. Patient advised to follow a low sodium diet and also to  lose weight. Renal function and electrolytes were WNL.  Complete Medication List: 1)  Alprazolam 1 Mg Tabs (Alprazolam) .... Take 1 tablet by mouth upto 5 times a day 2)  Zolpidem Tartrate 10 Mg Tabs (Zolpidem tartrate) .... Take 1 tab by mouth at bedtime as needed as needed for insomnia. 3)  Fluoxetine Hcl 40 Mg Caps (Fluoxetine hcl) .... Take 1 tablet by mouth once a day 4)  Flexeril 10 Mg Tabs (Cyclobenzaprine hcl) .Marland Kitchen.. 1 tablet by mouth twice a day. 5)  Omeprazole 20 Mg Cpdr (Omeprazole) .... Take 1 capsule by mouth twice daily. 6)  Amitriptyline Hcl 25 Mg Tabs (Amitriptyline hcl) .... Take 1 tab by mouth at bedtime  Patient Instructions: 1)  Please schedule a follow-up appointment in 2 months. 2)  Take medications as prescribed. 3)  Remember to keep water next to you at night, since the new medication can make you feel thirsty and  will cause mouth dryness. 4)  Follow a low sodium diet. 5)  You need to lose weight. Consider a lower calorie diet and regular exercise.  6)  Follow with your mental health appoinment. 7)  Check BP at least once a week (at the pharmacies there is a BP cuff which is free). Prescriptions: AMITRIPTYLINE HCL 25 MG TABS (AMITRIPTYLINE HCL) Take 1 tab by mouth at bedtime  #31 x 3   Entered and Authorized by:   Vassie Loll MD   Signed by:   Vassie Loll MD on 07/22/2009   Method used:   Print then Give to Patient   RxID:   [id number]    Prevention & Chronic Care Immunizations   Influenza vaccine: Not documented   Influenza vaccine deferral: Deferred  (07/06/2009)    Tetanus booster: Not documented   Td booster deferral: Deferred  (07/06/2009)    Pneumococcal vaccine: Not documented   Pneumococcal vaccine deferral: Deferred  (07/06/2009)  Other Screening   Pap smear: Not documented   Pap smear action/deferral: Deferred  (07/06/2009)    Mammogram: Not documented   Mammogram action/deferral: Deferred  (07/06/2009)   Smoking status: quit  (07/22/2009)  Lipids   Total Cholesterol: 182  (07/06/2009)   LDL: 99  (07/06/2009)   LDL Direct: Not documented   HDL: 67  (07/06/2009)   Triglycerides: 80  (07/06/2009)  Hypertension   Last Blood Pressure: 120 / 78  (07/22/2009)   Serum creatinine: 0.77  (07/06/2009)   Serum potassium 4.7  (07/06/2009)  Self-Management Support :   Personal Goals (by the next clinic visit) :      Personal blood pressure goal: 140/90  (07/06/2009)   Patient will work on the following items until the next clinic visit to reach self-care goals:     Medications and monitoring: take my medicines every day, bring all of my medications to every visit, weigh myself weekly  (07/22/2009)     Eating: eat more vegetables, use fresh or frozen vegetables, eat fruit for snacks and desserts, limit or avoid alcohol  (07/22/2009)     Activity: take a 30 minute  walk every day, take the stairs instead of the elevator, park at the far end of the parking lot  (07/06/2009)    Hypertension self-management support: Written self-care plan  (07/06/2009)

## 2010-07-04 NOTE — Progress Notes (Signed)
Summary: med refill/gp  Phone Note Refill Request Message from:  Fax from Pharmacy on February 17, 2010 11:13 AM  Refills Requested: Medication #1:  VICODIN 5-500 MG TABS Take 1 tablet three time a day as needed for pain.   Last Refilled: 01/17/2010 Last appt. July 13.   Method Requested: Telephone to Pharmacy Initial call taken by: Chinita Pester RN,  February 17, 2010 11:13 AM  Follow-up for Phone Call        Refill approved-nurse to complete.  Please schedule an appointment with PCP and notify patient. Follow-up by: Margarito Liner MD,  February 17, 2010 4:27 PM  Additional Follow-up for Phone Call Additional follow up Details #1::        Rx called to pharmacy Additional Follow-up by: Angelina Ok RN,  February 17, 2010 4:52 PM    Prescriptions: VICODIN 5-500 MG TABS (HYDROCODONE-ACETAMINOPHEN) Take 1 tablet three time a day as needed for pain.  #90 x 0   Entered and Authorized by:   Margarito Liner MD   Signed by:   Margarito Liner MD on 02/17/2010   Method used:   Telephoned to ...       [facility]* (retail)       [address]       [address] Shipping/mailing       [place], [place]  [address]       Ph: [phone]       Fax: [phone]   RxID:   [id number]

## 2010-07-04 NOTE — Miscellaneous (Signed)
Summary: HIPAA Restrictions  HIPAA Restrictions   Imported By: Florinda Marker 07/07/2009 09:53:07  _____________________________________________________________________  External Attachment:    Type:   Image     Comment:   External Document

## 2010-07-06 ENCOUNTER — Ambulatory Visit (HOSPITAL_COMMUNITY): Admit: 2010-07-06 | Payer: Self-pay | Admitting: Psychiatry

## 2010-07-06 ENCOUNTER — Encounter (HOSPITAL_COMMUNITY): Payer: PRIVATE HEALTH INSURANCE | Admitting: Psychiatry

## 2010-07-06 DIAGNOSIS — F4010 Social phobia, unspecified: Secondary | ICD-10-CM

## 2010-07-06 NOTE — Assessment & Plan Note (Signed)
Summary: NEED MEDICATION /SB.   Vital Signs:  Patient profile:   50 year old female Height:      66 inches Weight:      232.2 pounds BMI:     37.61 Temp:     97.8 degrees F oral Pulse rate:   74 / minute BP sitting:   145 / 94  (right arm)  Vitals Entered By: Filomena Jungling NT II (May 17, 2010 9:02 AM) CC: NEED REFILLS, Depression/ LEFT LEG AND HEEL Is Patient Diabetic? No Pain Assessment Patient in pain? yes     Location: LEFT HEAL Intensity: 6 Type: aching Onset of pain  Intermittent Nutritional Status BMI of > 30 = obese  Have you ever been in a relationship where you felt threatened, hurt or afraid?No   Does patient need assistance? Functional Status Self care Ambulation Normal   Primary Care Provider:  Vassie Loll MD  CC:  NEED REFILLS and Depression/ LEFT LEG AND HEEL.  History of Present Illness: 1. Presents with a left heel pain for 3 weeks; progressively getting worse, describes as "stepping on a knife," intermittent, worse with walking /beraing weight; slight improvment with rest, advil and vicodin. Denies any trauma, foot instability; fever,chills, redness or swelling. 2. RF on her meds.  Depression History:      The patient denies a depressed mood most of the day and a diminished interest in her usual daily activities.         Preventive Screening-Counseling & Management  Alcohol-Tobacco     Alcohol drinks/day: 0     Smoking Status: quit  Caffeine-Diet-Exercise     Does Patient Exercise: no  Allergies: No Known Drug Allergies   Impression & Recommendations:  Problem # 1:  FOOT PAIN, LEFT (ICD-729.5) Assessment New Likely 2/2 a heel spur. Will image left foot to evaluate for frx and for a definitive Dx. Patient refuses steroid injection for a pain relief. Well -coushioned shoes and weight managment advised; advil OTC as needed in addition to her regular Rx of a Vicodin that patient takes for her LBP. Orders: Diagnostic X-Ray/Fluoroscopy  (Diagnostic X-Ray/Flu) Podiatry Referral (Podiatry)  Problem # 2:  HYPERTENSION (ICD-401.9) Assessment: Unchanged  will start on a small dose of Lisinopril. Side-effects explained to the patient. Weight managment discussed. BP today: 145/94 Prior BP: 130/79 (03/03/2010)  Labs Reviewed: K+: 4.7 (07/06/2009) Creat: : 0.77 (07/06/2009)   Chol: 182 (07/06/2009)   HDL: 67 (07/06/2009)   LDL: 99 (07/06/2009)   TG: 80 (07/06/2009)  Her updated medication list for this problem includes:    Zestril 10 Mg Tabs (Lisinopril) .Marland Kitchen... Take one tablet by mouth daily for a high blood pressure  Complete Medication List: 1)  Alprazolam 1 Mg Tabs (Alprazolam) .... Take 1 tablet by mouth upto 5 times a day 2)  Zolpidem Tartrate 10 Mg Tabs (Zolpidem tartrate) .... Take 1 tab by mouth at bedtime as needed as needed for insomnia. 3)  Fluoxetine Hcl 40 Mg Caps (Fluoxetine hcl) .... Take 1 tablet by mouth once a day 4)  Flexeril 10 Mg Tabs (Cyclobenzaprine hcl) .... Take 1 tablet by mouth two times a day 5)  Omeprazole 20 Mg Cpdr (Omeprazole) .... Take 1 capsule by mouth twice daily. 6)  Vicodin 5-500 Mg Tabs (Hydrocodone-acetaminophen) .... Take 1 tablet three time a day as needed for pain. 7)  Hydrocortisone Acetate 25 Mg Supp (Hydrocortisone acetate) .... 2-3 times a day as needed for hemorrhoids 8)  Zestril 10 Mg Tabs (Lisinopril) .... Take  one tablet by mouth daily for a high blood pressure  Other Orders: Tdap => 15yrs IM (16109) Admin 1st Vaccine (60454) Mammogram (Screening) (Mammo)  Patient Instructions: 1)  Please, make an appointment for a Well woman exam. 2)  Please, follow up with a mammogram. 3)  You are being started on Lisinopril for a high blood pressure --plese, stop medication and call immediately or go to ER if develop rash, swelling, or wheezing. 4)  Please, take all your medications as prescribed. Prescriptions: VICODIN 5-500 MG TABS (HYDROCODONE-ACETAMINOPHEN) Take 1 tablet three time  a day as needed for pain.  #90 x 1   Entered and Authorized by:   Deatra Robinson MD   Signed by:   Deatra Robinson MD on 05/17/2010   Method used:   Print then Give to Patient   RxID:   [id number] ZESTRIL 10 MG TABS (LISINOPRIL) Take one tablet by mouth daily for a high blood pressure  #30 x 11   Entered and Authorized by:   Deatra Robinson MD   Signed by:   Deatra Robinson MD on 05/17/2010   Method used:   Print then Give to Patient   RxID:   [id number]    Orders Added: 1)  Est. Patient Level III [41324] 2)  Tdap => 31yrs IM [40102] 3)  Admin 1st Vaccine [90471] 4)  Diagnostic X-Ray/Fluoroscopy [Diagnostic X-Ray/Flu] 5)  Mammogram (Screening) [Mammo] 6)  Podiatry Referral [Podiatry]   Immunizations Administered:  Tetanus Vaccine:    Vaccine Type: Tdap    Site: right deltoid    Mfr: GlaxoSmithKline    Dose: 0.5 ml    Route: IM    Given by: Chinita Pester RN    Exp. Date: 03/23/2012    Lot #: [id number]    VIS given: 04/21/08 version given May 17, 2010.   Immunizations Administered:  Tetanus Vaccine:    Vaccine Type: Tdap    Site: right deltoid    Mfr: GlaxoSmithKline    Dose: 0.5 ml    Route: IM    Given by: Chinita Pester RN    Exp. Date: 03/23/2012    Lot #: [id number]    VIS given: 04/21/08 version given May 17, 2010.  Prevention & Chronic Care Immunizations   Influenza vaccine: Fluvax Non-MCR  (03/03/2010)   Influenza vaccine deferral: Deferred  (12/14/2009)   Influenza vaccine due: 02/03/2011    Tetanus booster: 05/17/2010: Tdap   Td booster deferral: Deferred  (12/14/2009)    Pneumococcal vaccine: Not documented   Pneumococcal vaccine deferral: Not indicated  (05/17/2010)  Other Screening   Pap smear: Not documented   Pap smear action/deferral: Deferred  (12/14/2009)   Pap smear due: 06/16/2010    Mammogram: Not documented   Mammogram action/deferral: Ordered  (05/17/2010)   Smoking status: quit  (05/17/2010)  Lipids    Total Cholesterol: 182  (07/06/2009)   LDL: 99  (07/06/2009)   LDL Direct: Not documented   HDL: 67  (07/06/2009)   Triglycerides: 80  (07/06/2009)   Lipid panel due: 07/06/2010  Hypertension   Last Blood Pressure: 145 / 94  (05/17/2010)   Serum creatinine: 0.77  (07/06/2009)   BMP action: Not indicated   Serum potassium 4.7  (07/06/2009)   Basic metabolic panel due: 07/06/2010    Hypertension flowsheet reviewed?: Yes   Progress toward BP goal: Deteriorated    Stage of readiness to change (hypertension management): Action  Self-Management Support :   Personal Goals (by the next clinic visit) :  Personal blood pressure goal: 140/90  (07/06/2009)   Patient will work on the following items until the next clinic visit to reach self-care goals:     Medications and monitoring: take my medicines every day, bring all of my medications to every visit  (05/17/2010)     Eating: eat more vegetables, use fresh or frozen vegetables, eat foods that are low in salt, eat baked foods instead of fried foods  (05/17/2010)     Activity: take the stairs instead of the elevator, park at the far end of the parking lot, join a walking program  (05/17/2010)    Hypertension self-management support: Education handout, Written self-care plan  (05/17/2010)   Hypertension self-care plan printed.   Hypertension education handout printed   Nursing Instructions: Give tetanus booster today Schedule screening mammogram (see order)

## 2010-07-06 NOTE — Progress Notes (Addendum)
Summary: Medication  Phone Note Call from Patient   Caller: Patient Call For: [facility] Summary of Call: Call from pt on 06/20/2010 said that she was recently started on Lisinopril 10 mg.  Said that she is feeling dizzy since starting the medication.  Medication was started 05/17/2010.  She said that she is weak no energy.  Has tried to take 1/2 tablet and this is not helping.  Wants to try and get something els with less side effects.  RTC to pt message left on answering machine for pt to call the Clinics. Angelina Ok RN  June 21, 2010 9:13 AM  Initial call taken by: Angelina Ok RN,  June 21, 2010 9:13 AM  Follow-up for Phone Call        Not clear why Lisinopril started-BP elevation was mild, and at a single visit, with previously borderline or normal values-no specific mention made in chart  I would stop the medication     Appended Document: Medication See note-I did not call patient so she'll need follow up Thanks Venita Sheffield DT  Appended Document: Medication RTC to pt to inform to stop the Lisinopril.  Message was left on pt's answering machine that the Clinics had returned a call about her medication.  Angelina Ok, RN June 21, 2010 12:16 PM.  Appended Document: Medication I contacted patient by voicemail, advised her to stop medication and follow up in 4 weeks for a recheck of BP

## 2010-07-06 NOTE — Assessment & Plan Note (Signed)
Summary: NP,L FOOT PAIN,XRAYS DONE 05/17/10,[facility]   Vital Signs:  Patient profile:   50 year old female Height:      65 inches Weight:      230 pounds BP sitting:   133 / 86  Vitals Entered By: Lillia Pauls CMA (June 13, 2010 3:19 PM)  Primary Care Provider:  Vassie Loll MD   History of Present Illness: 6 months of L heel pain, intermittent and worse with activity.  Improved with Vicodin, Flexeril used for chronic L lower back and radiating leg pain.  Outside x-ray shows heel spur.    The patient presents with a 6 month long history of heel pain. This is notable for worsening pain first thing in the morning when arising and standing after sitting - but not always in this scenario  Prior foot or ankle fractures: none Prior operations: none Orthotics or bracing: Dr. Margart Sickles half orthotics Medications: none PT or home rehab: none Night splints: no Ice massage: no Ball massage: no  Metatarsal pain: no   Allergies: No Known Drug Allergies  Past History:  Past medical, surgical, family and social histories (including risk factors) reviewed, and no changes noted (except as noted below).  Past Medical History: Reviewed history from 01/10/2010 and no changes required. Anemia Anxiety Disorder Depression Gallstones GERD Obesity  Past Surgical History: Reviewed history from 01/10/2010 and no changes required. Cholecystectomy  Family History: Reviewed history from 07/06/2009 and no changes required. Family hx significant for diabetes and HTN.  Social History: Reviewed history from 07/06/2009 and no changes required. Married Alcohol use-no Drug use-no Regular exercise-no  Review of Systems       REVIEW OF SYSTEMS  GEN: No systemic complaints, no fevers, chills, sweats, or other acute illnesses MSK: Detailed in the HPI GI: tolerating PO intake without difficulty Neuro: No numbness, parasthesias, or tingling associated. Otherwise the pertinent positives of the  ROS are noted above.    Physical Exam  General:  Well-developed,well-nourished,in no acute distress; alert,appropriate and cooperative throughout examination Head:  Normocephalic and atraumatic without obvious abnormalities. Eyes:  PERL Msk:  Echymosis: no Edema: no ROM: full LE B Gait: heel toe, non-antalgic MT pain: no Callus pattern: none Lateral Mall: NT Medial Mall: NT Talus: NT Navicular: NT Calcaneous: NT Metatarsals: NT 5th MT: NT Phalanges: NT Achilles: NT Plantar Fascia: tender, medial along PF. Pain with forced dorsi Fat Pad: NT Peroneals: NT Post Tib: NT Great Toe: Nml motion Ant Drawer: neg Other foot breakdown: none Long arch: moderate breakdown Transverse arch: preserved Hindfoot breakdown: none Sensation: intact    Impression & Recommendations:  Problem # 1:  FOOT PAIN, LEFT (ICD-729.5) Assessment New  Plantar fasciitis of L foot.  Gave shoe inserts and heel cup.  Instructed in stretching exercises.  Patient should return to clinic as needed.   We reviewed that stretching is critically important to the treatment of PF. Reviewed footwear. Rigid soles have been shown to help with PF. Reviewed rehab of stretching and calf raises.  The patient would be an ideal candidate for custom orthotics, which were shown in a 2008 Cochrane Review to be beneficial in PF - can do if she does not get better with conservative algorhithm. Could benefit from a corticosteroid injection if conservative treatment fails.   Orders: Foot Orthosis ( Arch Strap/Heel Cup) [phone]) Sports Insoles [phone])  Problem # 2:  PLANTAR FASCIITIS (ICD-728.71) Assessment: New The achilles spur seen on plan xrays is not clinically significant in this case. No pain or  symptoms of achilles tendinopathy. films independently reviewed.  Orders: Foot Orthosis ( Arch Strap/Heel Cup) [phone]) Sports Insoles [phone]  Complete Medication List: 1)  Alprazolam 1 Mg Tabs (Alprazolam) .... Take 1  tablet by mouth upto 5 times a day 2)  Zolpidem Tartrate 10 Mg Tabs (Zolpidem tartrate) .... Take 1 tab by mouth at bedtime as needed as needed for insomnia. 3)  Fluoxetine Hcl 40 Mg Caps (Fluoxetine hcl) .... Take 1 tablet by mouth once a day 4)  Flexeril 10 Mg Tabs (Cyclobenzaprine hcl) .... Take 1 tablet by mouth two times a day 5)  Omeprazole 20 Mg Cpdr (Omeprazole) .... Take 1 capsule by mouth twice daily. 6)  Vicodin 5-500 Mg Tabs (Hydrocodone-acetaminophen) .... Take 1 tablet three time a day as needed for pain. 7)  Hydrocortisone Acetate 25 Mg Supp (Hydrocortisone acetate) .... 2-3 times a day as needed for hemorrhoids 8)  Zestril 10 Mg Tabs (Lisinopril) .... Take one tablet by mouth daily for a high blood pressure   Orders Added: 1)  Foot Orthosis ( Arch Strap/Heel Cup) [U9811] 2)  New Patient Level III [91478] 3)  Sports Insoles [L3510]

## 2010-07-12 NOTE — Progress Notes (Signed)
Summary: PAP result information  Phone Note Outgoing Call   Summary of Call: PAP new date found per [facility] Feb 17,2011 at [facility]  July 03, 2010 11:45 AM

## 2010-07-17 ENCOUNTER — Encounter: Payer: Self-pay | Admitting: Internal Medicine

## 2010-07-17 ENCOUNTER — Ambulatory Visit (INDEPENDENT_AMBULATORY_CARE_PROVIDER_SITE_OTHER): Payer: PRIVATE HEALTH INSURANCE | Admitting: Internal Medicine

## 2010-07-17 VITALS — BP 144/89 | HR 88 | Temp 97.0°F | Ht 65.0 in | Wt 233.2 lb

## 2010-07-17 DIAGNOSIS — M545 Low back pain: Secondary | ICD-10-CM

## 2010-07-17 DIAGNOSIS — N898 Other specified noninflammatory disorders of vagina: Secondary | ICD-10-CM | POA: Insufficient documentation

## 2010-07-17 DIAGNOSIS — I10 Essential (primary) hypertension: Secondary | ICD-10-CM

## 2010-07-17 DIAGNOSIS — L293 Anogenital pruritus, unspecified: Secondary | ICD-10-CM

## 2010-07-17 MED ORDER — HYDROCODONE-ACETAMINOPHEN 5-500 MG PO TABS: 1.0000 | ORAL_TABLET | Freq: Three times a day (TID) | ORAL | Status: DC | PRN

## 2010-07-17 MED ORDER — HYDROCHLOROTHIAZIDE 12.5 MG PO CAPS: 12.5000 mg | ORAL_CAPSULE | Freq: Every day | ORAL | Status: DC

## 2010-07-17 NOTE — Assessment & Plan Note (Signed)
She reports back pain radiating to her left leg that has unchanged from the past .She also had one episode yesterday that got better with vicodin and flexeril. She had X- ray in Feb 2011 that showed retrolisthesis of L3 on L4. She has been on  Chronic narcotics for her back pain. Her pain contract was renewed today and she also underwent a urine drug screen. It was discussed with the patient that she needs to get physical therapy - but she was not ready at this time, though she has tried that before. It was explained to the patient that narcotics are not long term solution to her problem and she should take them only when necessary. Her prescription for vicodin was refilled.

## 2010-07-17 NOTE — Progress Notes (Signed)
  Subjective:    Patient ID: Brenda Delacruz, female    DOB: 06-22-1960, 50 y.o.   MRN: [id number]  HPI: 50 y/o woman with PMH significant for chronic lower back pain likely secondary to retrolisthesis of L3 on L4 comes to the clinic for a follow up visit. She reports that she could not sleep well last night because she was having left leg pain, she took some flexeril that helped her. Her pain started all the way from her lower back radiating all the way down to her left leg. She denies any associted tingling or numbness. She also complained of some itching in her genitals for last week and thinks that she probably has a yeast infection. But denies dysuria, urgency, frequency.  She states that she has not been taking her BP medication as it makes her weak and tired.    Review of Systems  Constitutional: Negative for fever, chills, diaphoresis, activity change, appetite change and fatigue.  HENT: Negative for hearing loss, ear pain, nosebleeds, congestion, facial swelling, neck stiffness, tinnitus and ear discharge.   Respiratory: Negative for wheezing and stridor.   Cardiovascular: Negative for leg swelling.  Gastrointestinal: Negative for abdominal distention.  Genitourinary: Negative for dysuria, urgency, frequency, hematuria and flank pain.  Musculoskeletal: Positive for back pain. Negative for joint swelling and arthralgias.  Neurological: Negative for dizziness, facial asymmetry, light-headedness and numbness.       Objective:   Physical Exam  Constitutional: She is oriented to person, place, and time. She appears well-developed and well-nourished.  HENT:  Head: Normocephalic and atraumatic.  Eyes: Conjunctivae and EOM are normal. Pupils are equal, round, and reactive to light.  Neck: Normal range of motion. Neck supple.  Cardiovascular: Normal rate, regular rhythm, normal heart sounds and intact distal pulses.   Pulmonary/Chest: Effort normal and breath sounds normal.  Abdominal:  Soft. Bowel sounds are normal.  Musculoskeletal: Normal range of motion.  Neurological: She is alert and oriented to person, place, and time. She has normal reflexes.  ;        Assessment & Plan:

## 2010-07-17 NOTE — Assessment & Plan Note (Signed)
She complained of some new vaginal itching that started last week . It is associated with some burning, but she denies any dysuria, urgency, frequency or discharge. DD include candidiasis vs trichomonas.Will do a wet prep. Depending on the test results, will treat the patient accordingly.

## 2010-07-17 NOTE — Patient Instructions (Addendum)
For your lower back pain, you can use heat or cold compresses. I will inform you with your test results. Pleases schedule a follow up appointment in 2 months.

## 2010-07-17 NOTE — Assessment & Plan Note (Signed)
Her BP was running slightly high today. She was started on lisinopril during her last visit but she says that she could not tolerate that medication as it makes her weak and tired. Her medication was changed to hydrochlothiazide today.

## 2010-07-18 LAB — DRUGS OF ABUSE SCREEN W/O ALC, ROUTINE URINE
Benzodiazepines.: NEGATIVE
Cocaine Metabolites: NEGATIVE
Marijuana Metabolite: NEGATIVE
Phencyclidine (PCP): NEGATIVE
Propoxyphene: NEGATIVE

## 2010-07-18 LAB — C. TRACHOMATIS / N. GONORRHOEAE, DNA PROBE
Gardnerella vaginalis: NEGATIVE
Trichomonas vaginosis: NEGATIVE

## 2010-07-18 LAB — WET PREP BY MOLECULAR PROBE: Gardnerella vaginalis: NEGATIVE

## 2010-07-19 ENCOUNTER — Encounter (HOSPITAL_COMMUNITY): Payer: PRIVATE HEALTH INSURANCE | Admitting: Physician Assistant

## 2010-07-19 DIAGNOSIS — F4010 Social phobia, unspecified: Secondary | ICD-10-CM

## 2010-07-24 ENCOUNTER — Encounter (HOSPITAL_COMMUNITY): Payer: PRIVATE HEALTH INSURANCE | Admitting: Psychiatry

## 2010-08-04 ENCOUNTER — Encounter (HOSPITAL_COMMUNITY): Payer: PRIVATE HEALTH INSURANCE | Admitting: Psychiatry

## 2010-08-15 ENCOUNTER — Encounter: Payer: Self-pay | Admitting: Internal Medicine

## 2010-08-23 LAB — POCT URINALYSIS DIP (DEVICE)
Hgb urine dipstick: NEGATIVE
Protein, ur: NEGATIVE mg/dL
Specific Gravity, Urine: <=1.005 (ref 1.005–1.030)
Urobilinogen, UA: 0.2 mg/dL (ref 0.0–1.0)

## 2010-09-07 ENCOUNTER — Encounter (HOSPITAL_BASED_OUTPATIENT_CLINIC_OR_DEPARTMENT_OTHER): Payer: PRIVATE HEALTH INSURANCE | Admitting: Psychiatry

## 2010-09-07 DIAGNOSIS — F4010 Social phobia, unspecified: Secondary | ICD-10-CM

## 2010-09-15 ENCOUNTER — Ambulatory Visit (INDEPENDENT_AMBULATORY_CARE_PROVIDER_SITE_OTHER): Payer: Self-pay | Admitting: Ophthalmology

## 2010-09-15 ENCOUNTER — Encounter: Payer: Self-pay | Admitting: Ophthalmology

## 2010-09-15 VITALS — BP 136/80 | HR 88 | Temp 97.0°F | Ht 65.0 in | Wt 231.5 lb

## 2010-09-15 DIAGNOSIS — F341 Dysthymic disorder: Secondary | ICD-10-CM

## 2010-09-15 DIAGNOSIS — D649 Anemia, unspecified: Secondary | ICD-10-CM

## 2010-09-15 DIAGNOSIS — M545 Low back pain: Secondary | ICD-10-CM

## 2010-09-15 DIAGNOSIS — M79609 Pain in unspecified limb: Secondary | ICD-10-CM

## 2010-09-15 DIAGNOSIS — K219 Gastro-esophageal reflux disease without esophagitis: Secondary | ICD-10-CM

## 2010-09-15 DIAGNOSIS — I10 Essential (primary) hypertension: Secondary | ICD-10-CM

## 2010-09-15 LAB — IRON AND TIBC: Iron: 26 ug/dL — ABNORMAL LOW (ref 42–145)

## 2010-09-15 LAB — CBC
HCT: 33.6 % — ABNORMAL LOW (ref 36.0–46.0)
Hemoglobin: 10.4 g/dL — ABNORMAL LOW (ref 12.0–15.0)
MCH: 24.9 pg — ABNORMAL LOW (ref 26.0–34.0)
MCHC: 31 g/dL (ref 30.0–36.0)

## 2010-09-15 LAB — BASIC METABOLIC PANEL
BUN: 7 mg/dL (ref 6–23)
Calcium: 9.3 mg/dL (ref 8.4–10.5)
Glucose, Bld: 111 mg/dL — ABNORMAL HIGH (ref 70–99)

## 2010-09-15 MED ORDER — HYDROCODONE-ACETAMINOPHEN 5-500 MG PO TABS: 1.0000 | ORAL_TABLET | Freq: Three times a day (TID) | ORAL | Status: DC | PRN

## 2010-09-15 NOTE — Progress Notes (Signed)
  Subjective:    Patient ID: Brenda Delacruz, female    DOB: May 28, 1961, 50 y.o.   MRN: [id number]  HPI  This is a 50 year old female with a history of morbid obesity, hypertension, depression, who presents for routine followup. The patient's only complaint today is her chronic back pain which is unchanged in nature. The pain is described as sharp, worsened by movement, and radiates down her leg. This is not associated with any increased weakness or numbness, and the patient denies any changes in her bowel or bladder habits. Patient denies any fevers or chills, or any changes in the nature of her pain. The patient states that her depression is under reasonable control, and that her only new symptoms since her last visit are increased number of hot flashes. Last menstrual period was back in December of last year. The patient previously had pain in her heel, however she is showering orthotics and this has resolved.  Review of Systems  Constitutional: Negative for fever and chills.  Respiratory: Negative for cough and shortness of breath.   Cardiovascular: Negative for chest pain and palpitations.  Gastrointestinal: Negative for vomiting, diarrhea and constipation.       Objective:   Physical Exam  Constitutional: She appears well-developed and well-nourished.  HENT:  Head: Normocephalic and atraumatic.  Eyes: Pupils are equal, round, and reactive to light.  Cardiovascular: Normal rate, regular rhythm and intact distal pulses.  Exam reveals no gallop and no friction rub.   No murmur heard. Pulmonary/Chest: Effort normal and breath sounds normal. She has no wheezes. She has no rales.  Abdominal: Soft. Bowel sounds are normal. She exhibits no distension. There is no tenderness.  Musculoskeletal: Normal range of motion.       The patient has tenderness to palpation over the left sacroiliac joint. Straight leg test is negative bilaterally, sensation is intact to light touch, strength is within normal  limits bilaterally.  Neurological: She is alert. No cranial nerve deficit.  Skin: No rash noted.        Current Outpatient Prescriptions on File Prior to Visit  Medication Sig Dispense Refill  . ALPRAZolam (XANAX) 1 MG tablet Take one tablet by mouth up to 5 times a day       . cyclobenzaprine (FLEXERIL) 10 MG tablet Take 10 mg by mouth 2 (two) times daily.        Marland Kitchen FLUoxetine (PROZAC) 40 MG capsule Take 40 mg by mouth daily.        . hydrocortisone (ANUSOL-HC) 25 MG suppository Place 25 mg rectally 2 (two) times daily as needed.        Marland Kitchen omeprazole (PRILOSEC) 20 MG capsule Take 20 mg by mouth 2 (two) times daily.        Marland Kitchen zolpidem (AMBIEN) 10 MG tablet Take 10 mg by mouth at bedtime as needed.           Past Medical History  Diagnosis Date  . Anemia   . Anxiety   . Depression   . Gallstones   . GERD (gastroesophageal reflux disease)   . Obesity      Assessment & Plan:

## 2010-09-15 NOTE — Patient Instructions (Signed)
Please return in 2 months for routine followup. Call sooner if you  have problems.

## 2010-09-15 NOTE — Assessment & Plan Note (Signed)
This was secondary to heel spur, the patient is now wearing orthotics and has gotten drastic improvement in her symptoms.

## 2010-09-15 NOTE — Assessment & Plan Note (Signed)
The patients blood pressure was within reasonable control today (BP: 136/80 mmHg ) and I will not make any adjustments to the patients anti-hypertensive regimen. I will continue to monitor and titrate the patients medications as needed at future visits.   I will check a BMET today to follow up on the patients electrolytes.

## 2010-09-15 NOTE — Assessment & Plan Note (Signed)
The patient continues to have low back pain with sciatic pain which radiates down her left leg. The patient has not endorsed any new symptoms, and her symptoms are controlled with her Flexeril and Vicodin. Patient has signed a pain contract and though her last UA did not demonstrate any opiates, I do not know when her last dose was. I will refill the patient's Vicodin today for her back pain to do believe she has legitimate organic disease.

## 2010-09-15 NOTE — Assessment & Plan Note (Addendum)
Hgb 9/11 was 10.2 which was stable.  I will repeat a cbc and iron panel today as given that the patient is now menopausal, she may no longer be iron deficient.

## 2010-09-15 NOTE — Assessment & Plan Note (Signed)
This is stable 

## 2010-09-15 NOTE — Assessment & Plan Note (Signed)
The patients GERD is under good control at this time and the patient is taking their medications regularly.  The patient was encouraged to continue to avoid triggers, avoid laying flat within two hours of a meal.  Will continue to monitor for continued control at future visits.   

## 2010-09-18 ENCOUNTER — Telehealth: Payer: Self-pay | Admitting: Ophthalmology

## 2010-09-18 MED ORDER — FERROUS GLUCONATE IRON 246 (28 FE) MG PO TABS: 246.0000 mg | ORAL_TABLET | Freq: Two times a day (BID) | ORAL | Status: DC

## 2010-09-18 NOTE — Telephone Encounter (Signed)
The patient continues to have severe iron deficiency, but is no longer menstruating, and had a normal colonoscopy 9/11.  It is possible that the patient was simply never repleated because she was unable to tolerate ferrous sulfate. I will start ferrous gluconate today and have the patient return in 1 month to follow up on ferritin, retic count and CBC.  Pt currently has no complaints concerning for anemia and denies any obvious blood loss.

## 2010-10-05 ENCOUNTER — Encounter (HOSPITAL_BASED_OUTPATIENT_CLINIC_OR_DEPARTMENT_OTHER): Payer: PRIVATE HEALTH INSURANCE | Admitting: Psychiatry

## 2010-10-05 DIAGNOSIS — F4010 Social phobia, unspecified: Secondary | ICD-10-CM

## 2010-10-12 ENCOUNTER — Other Ambulatory Visit: Payer: Self-pay | Admitting: *Deleted

## 2010-10-13 ENCOUNTER — Other Ambulatory Visit: Payer: PRIVATE HEALTH INSURANCE

## 2010-10-13 ENCOUNTER — Other Ambulatory Visit: Payer: Self-pay | Admitting: *Deleted

## 2010-10-13 ENCOUNTER — Encounter (HOSPITAL_COMMUNITY): Payer: PRIVATE HEALTH INSURANCE | Admitting: Physician Assistant

## 2010-10-13 DIAGNOSIS — M545 Low back pain, unspecified: Secondary | ICD-10-CM

## 2010-10-13 DIAGNOSIS — F4010 Social phobia, unspecified: Secondary | ICD-10-CM

## 2010-10-13 MED ORDER — CYCLOBENZAPRINE HCL 10 MG PO TABS: 10.0000 mg | ORAL_TABLET | Freq: Two times a day (BID) | ORAL | Status: DC

## 2010-10-13 MED ORDER — HYDROCODONE-ACETAMINOPHEN 5-500 MG PO TABS: 1.0000 | ORAL_TABLET | Freq: Three times a day (TID) | ORAL | Status: AC | PRN

## 2010-10-13 NOTE — Telephone Encounter (Signed)
Vicodin rx called to [facility] per pt.'s request.

## 2010-10-14 LAB — DRUGS OF ABUSE SCREEN W/O ALC, ROUTINE URINE
Marijuana Metabolite: NEGATIVE
Methadone: NEGATIVE
Opiate Screen, Urine: NEGATIVE
Propoxyphene: NEGATIVE

## 2010-10-17 NOTE — Group Therapy Note (Signed)
NAME:  Brenda Delacruz, Brenda Delacruz                ACCOUNT NO.:  [id number]   MEDICAL RECORD NO.:  [id number]          PATIENT TYPE:  WOC   LOCATION:  [facility]                   FACILITY:  [facility]   PHYSICIAN:  Allie Bossier, MD        DATE OF BIRTH:  07/23/1960   DATE OF SERVICE:  04/28/2008                                  CLINIC NOTE   HISTORY:  The patient presents today with complaint of left pain in her  left ovarian area.  She states that this pain first began in March 2009.  She had an evaluation through the [facility] Department for  Pap screening at which time she was evaluated by a nurse practitioner  who stated that she could have a left ovarian cyst because she had pain  on examination of her left ovary.  She was referred to the GYN Clinic at  [facility].  She is being currently treated for anxiety and depression as well as  insomnia by Dr. Rae Halsted at [facility].   ALLERGIES:  NO KNOWN DRUG ALLERGIES.   CURRENT MEDICATIONS:  1. Prozac.  2. Xanax.  3. Ambien.   PRIMARY CARE Coleen Cardiff:  Dr. Lovell Sheehan on [facility] in [place].   IMMUNIZATIONS:  Up to date for tetanus and flu.   MENSTRUAL HISTORY:  Very irregular.  Her menstrual cycles began at the  age of 58.  She has had only spotting on and off for the past year.  The  last vaginal spotting that she had was in September 2009.  Prior to  that, she states that her periods while irregular lasted for 5 days were  heavy and very painful.  She is not currently using anything for  contraception as she has not been sexually active for approximately 2  years secondary to her husband's medical issues and inability for them  to have intercourse.   OBSTETRICAL HISTORY:  She has been pregnant 2 times.  She has 1 living  child and she has had 1 miscarriage.  Her last pregnancy was 23 years  ago.   GYNECOLOGIC HISTORY:  Her last Pap smear was in September 2009 and it  was normal.  She does have a history 10 years  ago of abnormal Pap smear  that was treated with what she says laser surgery, however, she is  unsure.  Her last mammogram was in October 2009 that was reported as  normal.   PAST SURGICAL HISTORY:  Positive for gallbladder removal over 10 years  ago at [facility].   FAMILY HISTORY:  Positive for heart disease in her brother.  Other  significant diseases or health problems in her family are positive for  depression, anxiety and anemia.   PERSONAL MEDICAL HISTORY:  Positive for mental illness and diabetes  which is being treated by Dr. Lovell Sheehan.  In addition to her mental  illness being treated by Dr. Rae Halsted at [facility].   SOCIAL HISTORY:  She lives with her spouse.  She is a nonsmoker.  She  does not drink alcohol.  She does drink caffeinated beverages.  She  does  not work outside the home.   PHYSICAL EXAMINATION:  GENERAL:  Ms. Dozier is a Caucasian female 50  years old, who appears to be older than her stated age.  VITAL SIGNS:  Stable.  Her blood pressure today is 126/72.  Her weight  is 225.3, her height is 5 feet 4-1/2 inches.  Her examination is problem-  focused today on her left side.  ABDOMEN:  Moderately obese with irregular stria.  She has a nontender  abdomen with no masses.  GU:  On examination of her genitalia, she is a Tanner 5.  She does have  an erythematous area of her perineum visible area of scarring where she  says she had episiotomy with her last child.  She a moderate amount of  tenderness to palpation of the posterior portion of the introitus that  she states is similar to a tearing sensation.  She has irregular rugae.  Her cervix is parous.  It is smooth and without lesion.  External  genitalia and mucous membranes are also without lesions.  She has  irregular rugae and good tone.  On bimanual exam, bimanual exam is  limited by patient habitus.  However, there is no cervical motion  tenderness.  The uterus is slightly enlarged, but within normal  limits.  She does have left adnexal tenderness upon palpation, but no enlargement  is palpated during the exam.  However again, the examination is limited  by the patient's habitus.   ASSESSMENT/PLAN:  The patient has pelvic pain.  Plan is to send the  patient for a pelvic ultrasound.  We will have her return to clinic.  The patient's ultrasound was scheduled for May 04, 2008 at 1:15 at  [facility].  She will return approximately 1-2 weeks after this  ultrasound to discuss the findings and treatment options if necessary.  The patient is also given a prescription for naproxen for pain  management currently.     ______________________________  Maylon Cos, CNM    ______________________________  Allie Bossier, MD    SS/MEDQ  D:  04/28/2008  T:  04/29/2008  Job:  [id number]

## 2010-11-09 ENCOUNTER — Encounter (HOSPITAL_BASED_OUTPATIENT_CLINIC_OR_DEPARTMENT_OTHER): Payer: PRIVATE HEALTH INSURANCE | Admitting: Psychiatry

## 2010-11-09 DIAGNOSIS — F4010 Social phobia, unspecified: Secondary | ICD-10-CM

## 2010-11-14 ENCOUNTER — Ambulatory Visit (INDEPENDENT_AMBULATORY_CARE_PROVIDER_SITE_OTHER): Payer: PRIVATE HEALTH INSURANCE | Admitting: Internal Medicine

## 2010-11-14 DIAGNOSIS — F3289 Other specified depressive episodes: Secondary | ICD-10-CM

## 2010-11-14 DIAGNOSIS — M545 Low back pain, unspecified: Secondary | ICD-10-CM

## 2010-11-14 DIAGNOSIS — K219 Gastro-esophageal reflux disease without esophagitis: Secondary | ICD-10-CM

## 2010-11-14 DIAGNOSIS — D509 Iron deficiency anemia, unspecified: Secondary | ICD-10-CM

## 2010-11-14 DIAGNOSIS — G2581 Restless legs syndrome: Secondary | ICD-10-CM

## 2010-11-14 DIAGNOSIS — R232 Flushing: Secondary | ICD-10-CM

## 2010-11-14 DIAGNOSIS — F329 Major depressive disorder, single episode, unspecified: Secondary | ICD-10-CM

## 2010-11-14 DIAGNOSIS — N951 Menopausal and female climacteric states: Secondary | ICD-10-CM

## 2010-11-14 DIAGNOSIS — D649 Anemia, unspecified: Secondary | ICD-10-CM

## 2010-11-14 MED ORDER — CYCLOBENZAPRINE HCL 10 MG PO TABS: 10.0000 mg | ORAL_TABLET | Freq: Two times a day (BID) | ORAL | Status: DC

## 2010-11-14 MED ORDER — OMEPRAZOLE 20 MG PO CPDR: 20.0000 mg | DELAYED_RELEASE_CAPSULE | Freq: Every day | ORAL | Status: DC

## 2010-11-14 MED ORDER — HYDROCODONE-ACETAMINOPHEN 5-500 MG PO TABS: 1.0000 | ORAL_TABLET | Freq: Three times a day (TID) | ORAL | Status: DC | PRN

## 2010-11-14 MED ORDER — VENLAFAXINE HCL ER 37.5 MG PO CP24: 37.5000 mg | ORAL_CAPSULE | Freq: Every day | ORAL | Status: DC

## 2010-11-14 MED ORDER — FERROUS GLUCONATE IRON 246 (28 FE) MG PO TABS: 246.0000 mg | ORAL_TABLET | Freq: Two times a day (BID) | ORAL | Status: AC

## 2010-11-14 NOTE — Progress Notes (Signed)
  Subjective:    Patient ID: Brenda Delacruz, female    DOB: [birth year], 50 y.o.   MRN: [id number]  HPI: 50 year old woman with past medical history significant for morbid obesity, iron deficiency anemia, depression comes to the clinic for a followup visit. She complains of chronic pain in her back radiating to her left leg that has been unchanged from the past. She also reports worsening of her leg paresthesias, located bilaterally below knee to the calf.  She is barely able to sleep the because of the pain and numbness.  She is also concerned about increasing hot flashes. Her last menstrual period was in December of 2011. Of note patient was given a prescription for ferrous gluconate about a month ago but she couldn't get it refilled because it was not available at [facility] pharmacy and she could not find the medicine OTC.    Review of Systems  Constitutional: Negative for chills, diaphoresis, activity change, appetite change and fatigue.  HENT: Negative for congestion, rhinorrhea, sneezing, neck pain, neck stiffness and postnasal drip.   Respiratory: Negative for cough, chest tightness, shortness of breath and wheezing.   Cardiovascular: Negative for chest pain, palpitations and leg swelling.  Gastrointestinal: Negative for abdominal distention.  Genitourinary: Negative for dysuria, frequency, flank pain and difficulty urinating.  Musculoskeletal: Negative for arthralgias.  Neurological: Negative for dizziness.  Hematological: Negative for adenopathy.       Objective:   Physical Exam  Constitutional: She is oriented to person, place, and time. She appears well-developed and well-nourished.  HENT:  Head: Normocephalic and atraumatic.  Eyes: Conjunctivae and EOM are normal. Pupils are equal, round, and reactive to light.  Neck: Normal range of motion. Neck supple. No tracheal deviation present. No thyromegaly present.  Cardiovascular: Normal rate, regular rhythm, normal heart sounds and  intact distal pulses.   Pulmonary/Chest: Effort normal and breath sounds normal. No respiratory distress. She has no wheezes. She has no rales. She exhibits no tenderness.  Abdominal: Soft. Bowel sounds are normal. She exhibits no distension and no mass. There is no tenderness. There is no rebound and no guarding.  Musculoskeletal: Normal range of motion.  Neurological: She is alert and oriented to person, place, and time. She has normal reflexes.  Skin: Skin is warm.          Assessment & Plan:

## 2010-11-14 NOTE — Patient Instructions (Signed)
Please take your medicines as prescribed. Please schedule a follow up appointment in 2 months or earlier if needed. 

## 2010-11-15 ENCOUNTER — Encounter: Payer: Self-pay | Admitting: Internal Medicine

## 2010-11-15 DIAGNOSIS — R232 Flushing: Secondary | ICD-10-CM | POA: Insufficient documentation

## 2010-11-15 NOTE — Assessment & Plan Note (Signed)
Worse recently likely a low ferritin. She was advised to take iron supplementation which will help controlling her symptoms.

## 2010-11-15 NOTE — Assessment & Plan Note (Signed)
Symptoms well controlled. Her prescription for omeprazole was refilled.

## 2010-11-15 NOTE — Assessment & Plan Note (Addendum)
Patient has severe iron deficiency anemia with last colonoscopy in 09/11 that showed mild diverticulosis and internal hemorrhoids. The likely cause for her iron deficiency anemia was heavy menstrual periods. Her H&H from last CBC in April of 2012 was 10.4 and 33.6. Now that she has menopause it is possible that she might be able to overcome her cause for anemia. Patient was given a prescription for ferrous gluconate in 04/12 which she never got filled.  Her prescription for ferrous gluconate was again refilled today and and was advised to call the clinic if she is still not able to get that ---so that we can at least start her on on once daily dosing of ferrous sulfate with meals, as she states that ferrous sulfate makes her sick on stomach.  Will not check her CBC and ferritin today but may consider repeating that in 6 months.

## 2010-11-15 NOTE — Assessment & Plan Note (Signed)
Her low back pain is form retrolisthesis of L3 over L4 . She she has signed a pain contract for narcotics and her last eating drug screen was in 05/11 but it was negative- not sure when she took her last dose for opiate. Her prescription for Vicodin was refilled with today's visit. She was also offered physical therapy but she refused.

## 2010-11-15 NOTE — Assessment & Plan Note (Signed)
Symptoms well controlled. Will continue current treatment.

## 2010-11-15 NOTE — Assessment & Plan Note (Addendum)
She is approaching menopause and was complaining of increased intractable hot flashes. She was discussed various options for her symptoms including hormonal therapy and SNRI's like venlaflaxine. She does not have breast cancer , neither a family history of breast cancer, no h/o VTE, CHD and was a former smoker. She was explained that she has very minimal risk with hormonal therapy. After having some discussion , it was concluded that venlaflaxine can be tried first but if her symptoms persist or get worse, then we will start her on estrogen with the next visit.

## 2010-12-11 ENCOUNTER — Encounter (HOSPITAL_BASED_OUTPATIENT_CLINIC_OR_DEPARTMENT_OTHER): Payer: PRIVATE HEALTH INSURANCE | Admitting: Psychiatry

## 2010-12-11 DIAGNOSIS — F4010 Social phobia, unspecified: Secondary | ICD-10-CM

## 2011-01-02 ENCOUNTER — Encounter (HOSPITAL_BASED_OUTPATIENT_CLINIC_OR_DEPARTMENT_OTHER): Payer: Self-pay | Admitting: Psychiatry

## 2011-01-02 DIAGNOSIS — F4010 Social phobia, unspecified: Secondary | ICD-10-CM

## 2011-01-15 ENCOUNTER — Encounter: Payer: Self-pay | Admitting: Internal Medicine

## 2011-01-15 ENCOUNTER — Ambulatory Visit (INDEPENDENT_AMBULATORY_CARE_PROVIDER_SITE_OTHER): Payer: Self-pay | Admitting: Internal Medicine

## 2011-01-15 DIAGNOSIS — M545 Low back pain, unspecified: Secondary | ICD-10-CM

## 2011-01-15 DIAGNOSIS — I10 Essential (primary) hypertension: Secondary | ICD-10-CM

## 2011-01-15 DIAGNOSIS — K219 Gastro-esophageal reflux disease without esophagitis: Secondary | ICD-10-CM

## 2011-01-15 DIAGNOSIS — IMO0002 Reserved for concepts with insufficient information to code with codable children: Secondary | ICD-10-CM

## 2011-01-15 MED ORDER — OMEPRAZOLE 20 MG PO CPDR: 20.0000 mg | DELAYED_RELEASE_CAPSULE | Freq: Every day | ORAL | Status: DC

## 2011-01-15 MED ORDER — HYDROCODONE-ACETAMINOPHEN 5-500 MG PO TABS: 1.0000 | ORAL_TABLET | Freq: Three times a day (TID) | ORAL | Status: DC | PRN

## 2011-01-15 NOTE — Assessment & Plan Note (Addendum)
She was still complaining of lower back pain radiating to her leg. She has retrolisthesis of L3 over L4. She was offered physical therapy but she says that she gets anxious with lot of people around her and has tried in the past but it didn't work for her. She said that she took her pain med( vicodin ) this Am. She was counseled on cutting down of the narcotics and their side effects.  States that she has been trying to cut down on her Xanax and would try cutting down on narcotics after that. Refill her pain medications. Check a UDS .

## 2011-01-15 NOTE — Progress Notes (Signed)
  Subjective:    Patient ID: Brenda Delacruz, female    DOB: Jul 01, 1960, 50 y.o.   MRN: [id number]  HPI: 50 year old woman with medical history significant for depression, hypertension, chronic low back pain comes to the clinic for followup visit.   She reports some occasional cramps around her neck radiating to her chest that gets better by themselves and occur every one to 2 months.   She was still complaining of back pain and was requesting for refill of her pain medications.    Review of Systems  Constitutional: Negative for fever, activity change, appetite change and fatigue.  HENT: Negative for nosebleeds, congestion, rhinorrhea, sneezing and postnasal drip.   Respiratory: Negative for apnea, cough, choking, chest tightness and shortness of breath.   Cardiovascular: Negative for chest pain, palpitations and leg swelling.  Gastrointestinal: Negative for nausea, abdominal pain, diarrhea and constipation.  Genitourinary: Negative for dysuria, hematuria and difficulty urinating.  Musculoskeletal: Positive for back pain. Negative for arthralgias.  Neurological: Negative for facial asymmetry, light-headedness and headaches.  Hematological: Negative for adenopathy.       Objective:   Physical Exam  Constitutional: She is oriented to person, place, and time. She appears well-developed and well-nourished. No distress.  HENT:  Head: Normocephalic and atraumatic.  Mouth/Throat: No oropharyngeal exudate.  Eyes: Conjunctivae and EOM are normal. Pupils are equal, round, and reactive to light. No scleral icterus.  Neck: Normal range of motion. Neck supple. No JVD present. No tracheal deviation present. No thyromegaly present.  Cardiovascular: Normal rate, regular rhythm and intact distal pulses.  Exam reveals no gallop and no friction rub.   No murmur heard. Pulmonary/Chest: Effort normal and breath sounds normal. No stridor. No respiratory distress. She has no wheezes. She has no rales. She  exhibits no tenderness.  Abdominal: Soft. Bowel sounds are normal. She exhibits no distension and no mass. There is no tenderness. There is no rebound and no guarding.  Musculoskeletal: Normal range of motion. She exhibits no edema and no tenderness.  Lymphadenopathy:    She has no cervical adenopathy.  Neurological: She is alert and oriented to person, place, and time. She has normal reflexes. She displays normal reflexes. No cranial nerve deficit. She exhibits normal muscle tone. Coordination normal.  Skin: Skin is warm and dry. She is not diaphoretic.          Assessment & Plan:

## 2011-01-15 NOTE — Patient Instructions (Signed)
Please schedule a follow up appointment in 3 months or earlier if needed. Please take your medicines as prescribed. 

## 2011-01-15 NOTE — Assessment & Plan Note (Signed)
Blood pressure slightly elevated. Her blood pressure has always been stable at previous visits . We'll continue to monitor for now. Patient was counseled on lifestyle changes including diet and exercise. Lab Results  Component Value Date   NA 139 09/15/2010   K 4.1 09/15/2010   CL 104 09/15/2010   CO2 25 09/15/2010   BUN 7 09/15/2010   CREATININE 0.69 09/15/2010    BP Readings from Last 3 Encounters:  01/15/11 143/92  11/14/10 120/80  09/15/10 136/80

## 2011-01-16 LAB — DRUGS OF ABUSE SCREEN W/O ALC, ROUTINE URINE
Benzodiazepines.: NEGATIVE
Creatinine,U: 56.9 mg/dL
Marijuana Metabolite: NEGATIVE
Methadone: NEGATIVE
Propoxyphene: NEGATIVE

## 2011-01-23 ENCOUNTER — Encounter (INDEPENDENT_AMBULATORY_CARE_PROVIDER_SITE_OTHER): Payer: Self-pay | Admitting: Psychiatry

## 2011-01-23 DIAGNOSIS — F4010 Social phobia, unspecified: Secondary | ICD-10-CM

## 2011-01-30 ENCOUNTER — Encounter: Payer: Self-pay | Admitting: Internal Medicine

## 2011-02-08 ENCOUNTER — Encounter (INDEPENDENT_AMBULATORY_CARE_PROVIDER_SITE_OTHER): Payer: Self-pay | Admitting: Physician Assistant

## 2011-02-08 DIAGNOSIS — F4010 Social phobia, unspecified: Secondary | ICD-10-CM

## 2011-02-09 ENCOUNTER — Encounter (HOSPITAL_COMMUNITY): Payer: PRIVATE HEALTH INSURANCE | Admitting: Physician Assistant

## 2011-02-12 ENCOUNTER — Encounter (HOSPITAL_COMMUNITY): Payer: Self-pay | Admitting: Psychiatry

## 2011-02-13 ENCOUNTER — Encounter (INDEPENDENT_AMBULATORY_CARE_PROVIDER_SITE_OTHER): Payer: Self-pay | Admitting: Psychiatry

## 2011-02-13 DIAGNOSIS — F4010 Social phobia, unspecified: Secondary | ICD-10-CM

## 2011-03-13 ENCOUNTER — Encounter (INDEPENDENT_AMBULATORY_CARE_PROVIDER_SITE_OTHER): Payer: Self-pay | Admitting: Psychiatry

## 2011-03-13 DIAGNOSIS — F4010 Social phobia, unspecified: Secondary | ICD-10-CM

## 2011-03-28 ENCOUNTER — Encounter (INDEPENDENT_AMBULATORY_CARE_PROVIDER_SITE_OTHER): Payer: Self-pay | Admitting: Physician Assistant

## 2011-03-28 DIAGNOSIS — F4010 Social phobia, unspecified: Secondary | ICD-10-CM

## 2011-04-02 ENCOUNTER — Ambulatory Visit (INDEPENDENT_AMBULATORY_CARE_PROVIDER_SITE_OTHER): Payer: Self-pay | Admitting: Internal Medicine

## 2011-04-02 ENCOUNTER — Encounter: Payer: Self-pay | Admitting: Internal Medicine

## 2011-04-02 VITALS — BP 127/89 | HR 88 | Temp 97.0°F | Ht 65.0 in | Wt 235.2 lb

## 2011-04-02 DIAGNOSIS — IMO0002 Reserved for concepts with insufficient information to code with codable children: Secondary | ICD-10-CM

## 2011-04-02 DIAGNOSIS — Z23 Encounter for immunization: Secondary | ICD-10-CM

## 2011-04-02 DIAGNOSIS — M545 Low back pain: Secondary | ICD-10-CM

## 2011-04-02 DIAGNOSIS — Z0000 Encounter for general adult medical examination without abnormal findings: Secondary | ICD-10-CM

## 2011-04-02 MED ORDER — HYDROCODONE-ACETAMINOPHEN 5-500 MG PO TABS: 1.0000 | ORAL_TABLET | Freq: Three times a day (TID) | ORAL | Status: DC | PRN

## 2011-04-02 NOTE — Patient Instructions (Signed)
Please schedule a follow up appointment in 3 months or earlier if needed. Please take your medications as prescribed.

## 2011-04-02 NOTE — Progress Notes (Signed)
  Subjective:    Patient ID: Brenda Delacruz, female    DOB: Jan 30, 1961, 50 y.o.   MRN: [id number]  HPI: 50 y/o woman with PMH for back pain from lumbar retrolisthesis of L3 over L4,  anxiety comes to the clinic for a follow up appointment.  She ended up cancelling her last clinic appointment when I called her to discuss the opiate regimen with the plans to ultimately terminate her pain contract. She said that her mother was not feeling well and was upset about her sister( patient's maternal aunt) who was sick-  Brenda Delacruz was spending time with her mother. She just expressed her concerns about pain medications with today's appointment.    Review of Systems  Constitutional: Negative for fever, chills, activity change and appetite change.  HENT: Negative for hearing loss, congestion, rhinorrhea, sneezing and postnasal drip.   Eyes: Negative for visual disturbance.  Respiratory: Negative for cough and shortness of breath.   Cardiovascular: Negative for chest pain, palpitations and leg swelling.  Gastrointestinal: Negative for nausea, abdominal pain, constipation and blood in stool.  Genitourinary: Negative for dysuria, urgency, frequency and flank pain.  Musculoskeletal: Negative for arthralgias.  Neurological: Negative for dizziness, facial asymmetry, light-headedness and headaches.  Hematological: Negative for adenopathy.       Objective:   Physical Exam  Constitutional: She is oriented to person, place, and time. She appears well-developed and well-nourished.  HENT:  Head: Normocephalic and atraumatic.  Mouth/Throat: No oropharyngeal exudate.  Eyes: Pupils are equal, round, and reactive to light.  Neck: Normal range of motion. Neck supple. No JVD present. No tracheal deviation present. No thyromegaly present.  Cardiovascular: Normal rate, regular rhythm, normal heart sounds and intact distal pulses.  Exam reveals no gallop and no friction rub.   No murmur heard. Pulmonary/Chest:  Effort normal and breath sounds normal. No stridor. No respiratory distress. She has no wheezes. She has no rales. She exhibits no tenderness.  Abdominal: Soft. Bowel sounds are normal. She exhibits no distension and no mass. There is no tenderness. There is no rebound and no guarding.  Musculoskeletal: Normal range of motion. She exhibits no edema and no tenderness.  Lymphadenopathy:    She has no cervical adenopathy.  Neurological: She is alert and oriented to person, place, and time. She has normal reflexes. She displays normal reflexes. No cranial nerve deficit. She exhibits normal muscle tone. Coordination normal.  Skin: Skin is warm.          Assessment & Plan:

## 2011-04-03 LAB — DRUGS OF ABUSE SCREEN W/O ALC, ROUTINE URINE
Barbiturate Quant, Ur: NEGATIVE
Creatinine,U: 18.7 mg/dL
Opiate Screen, Urine: NEGATIVE
Phencyclidine (PCP): NEGATIVE
Propoxyphene: NEGATIVE

## 2011-04-06 ENCOUNTER — Encounter (INDEPENDENT_AMBULATORY_CARE_PROVIDER_SITE_OTHER): Payer: Self-pay | Admitting: Psychiatry

## 2011-04-06 DIAGNOSIS — F4010 Social phobia, unspecified: Secondary | ICD-10-CM

## 2011-04-06 LAB — OPIATE, QUANTITATIVE, URINE: Hydrocodone: NEGATIVE NG/ML

## 2011-04-08 DIAGNOSIS — Z0000 Encounter for general adult medical examination without abnormal findings: Secondary | ICD-10-CM | POA: Insufficient documentation

## 2011-04-08 NOTE — Assessment & Plan Note (Signed)
I explained the patient that we will have to terminate her pain contract with our outpatient clinic in the setting of three negative UDS. It was hard for her to believe that her UDS were negative as apparently she was taking her pain medication everyday. She was also accompanied by her husband who insisted on the same fact. They requested pain clinic referral and some medicine to fill in for the time, when she would have her appointment scheduled with the pain management clinic. Her case was discussed with Dr. Phillips Odor who agreed with giving her, 2 week supply of  pain meds.She was clearly explained that this would be her last refill from our clinic. She had about 4 pills left in her bottle and she chewed half a pill of vicodin in front of me during the encounter when I asked her a sample for UDS, which is a red flag. I also believe that her retrolisthesis is not severe enough that would warrant lifelong pain meds. - Will terminate her pain contract. - Pain management clinic referral.

## 2011-04-08 NOTE — Assessment & Plan Note (Signed)
She got a flu- shot today.  

## 2011-04-09 ENCOUNTER — Encounter (HOSPITAL_COMMUNITY): Payer: Self-pay | Admitting: Psychiatry

## 2011-05-10 ENCOUNTER — Ambulatory Visit (HOSPITAL_COMMUNITY): Payer: Self-pay | Admitting: Physician Assistant

## 2011-05-22 ENCOUNTER — Other Ambulatory Visit (HOSPITAL_COMMUNITY): Payer: Self-pay | Admitting: Physician Assistant

## 2011-05-22 DIAGNOSIS — F4010 Social phobia, unspecified: Secondary | ICD-10-CM

## 2011-05-22 MED ORDER — ZOLPIDEM TARTRATE 10 MG PO TABS: 10.0000 mg | ORAL_TABLET | Freq: Every day | ORAL | Status: DC

## 2011-05-22 MED ORDER — ALPRAZOLAM 1 MG PO TABS: ORAL_TABLET | ORAL | Status: DC

## 2011-05-23 ENCOUNTER — Encounter (HOSPITAL_COMMUNITY): Payer: Self-pay | Admitting: Physician Assistant

## 2011-05-25 ENCOUNTER — Encounter (HOSPITAL_COMMUNITY): Payer: Self-pay | Admitting: Physician Assistant

## 2011-07-03 ENCOUNTER — Encounter: Payer: Self-pay | Attending: Physical Medicine & Rehabilitation

## 2011-07-03 ENCOUNTER — Ambulatory Visit: Payer: Self-pay | Admitting: Physical Medicine & Rehabilitation

## 2011-07-03 DIAGNOSIS — Z79899 Other long term (current) drug therapy: Secondary | ICD-10-CM | POA: Insufficient documentation

## 2011-07-03 DIAGNOSIS — G2581 Restless legs syndrome: Secondary | ICD-10-CM

## 2011-07-03 DIAGNOSIS — R209 Unspecified disturbances of skin sensation: Secondary | ICD-10-CM | POA: Insufficient documentation

## 2011-07-03 NOTE — Progress Notes (Signed)
CHIEF COMPLAINT:  Restless legs.  REASON FOR REFERRAL:  Back pain.  SUBJECTIVE:  A 51 year old female who has a chief complaint of restless legs all the time day and night.  She feels like she has to twitch them. She really does not have any severe pain in her legs.  She denies any numbness or tingling.  She does have pain in various areas of her body, right shoulder, epigastric, left hip area, but this does not bother her as much as her legs twitching.  She is independent with all her self- care and mobility, but has certain household duties that she cannot perform.  REVIEW OF SYSTEMS:  Positive for dizziness, confusion, depression, and anxiety.  Her Oswestry score is 42%.  CURRENT MEDICATIONS: 1. Hydrocodone 5/500 one p.o. q.8 h p.r.n., fill date April 02, 2011, #40, has 3-1/2 left. 2. Cyclobenzaprine 1 p.o. b.i.d. 3. Zolpidem 1 p.o. 10 mg at bedtime. 4. Alprazolam 1 mg 5 times per day. 5. Advil as needed. 6. Prozac 20 mg 2 p.o. daily. 7. Omeprazole 20 mg p.o. daily.  PAST SURGICAL HISTORY:  Cholecystectomy.  SOCIAL HISTORY:  Lives with her husband.  Denies any smoking or alcohol use or drug abuse.  She has a past history of alcoholism.  FAMILY HISTORY:  Alcohol abuse in the father, prescription drug abuse with accidental overdosing in brother.  PAST MEDICAL HISTORY:  Significant for depression, anxiety, and social phobia.  She has been treated in the past for hypertension as well.  I reviewed her lumbar spine MRI.  She has no disk space narrowing.  The x-ray report documented a minimal retrolisthesis L3 on L4, 2 mm which is really hard to appreciate even that amount.  Hip x-rays were negative.  PHYSICAL EXAMINATION:  GENERAL:  Slowed responses, flat affect.  She is able to follow commands, answers questions appropriately. MUSCULOSKELETAL:  Her neck has full range of motion.  No tenderness. Upper extremity strength, range of motion, and sensation are normal. Deep  tendon reflexes are normal in the upper and lower extremities.  In the lower extremities, she has no tenderness over the hip, knee, or ankles areas.  She has some decreased range of motion in the left hip internal rotation compared to the right side which is normal.  Knee and ankle range of motion are full.  Lower extremity strength is normal. Straight leg raising test is negative.  Back range of motion is about 50% range forward flexion, extension, lateral rotation, and bending.  No tenderness with any of these now.  Gait is slow.  No evidence toe drag or knee instability.  Sensation is intact to pinprick and proprioception in the lower extremities.  There is no evidence of muscle atrophy.  Good pulses in the feet and pedal and posterior tibial.  IMPRESSION: 1. Complaints of restless legs.  Neuromuscular exam is unremarkable,     however, is reasonable given her paresthesias.  I think it is     reasonable to further evaluate with EMG NCV to make sure she has no     neuropathic process.  In the meantime, we will start her on some     ReQuip 0.5 mg at bedtime, caution for sedation. 2. In terms her back pain, really does not seem to be an issue at the     current time.  Certainly, her x-ray findings and her exam would     suggest that narcotic analgesics are really not needed in this  situation.  Does not seem to be really taking these on a regular     basis at the current time.  I do see where she has had negative     urine drug screens in the past as well as she was reportedly taking     3 times per day medication.  Her opioid risk tool score is actually     14 putting her at high risk for developing problematic behaviors. 3. I will see her back for the EMG.  We will see how she does with     ReQuip.  If EMG is negative and ReQuip is not helpful, we would     make a referral to [facility] to further evaluate the     patient's symptoms.     Erick Colace,  M.D. Electronically Signed    AEK/MedQ D:  07/03/2011 11:57:25  T:  07/03/2011 15:29:38  Job #:  [id number]

## 2011-07-05 ENCOUNTER — Other Ambulatory Visit: Payer: Self-pay | Admitting: Family Medicine

## 2011-07-05 ENCOUNTER — Other Ambulatory Visit: Payer: Self-pay | Admitting: *Deleted

## 2011-07-05 DIAGNOSIS — M549 Dorsalgia, unspecified: Secondary | ICD-10-CM

## 2011-07-05 DIAGNOSIS — Z1231 Encounter for screening mammogram for malignant neoplasm of breast: Secondary | ICD-10-CM

## 2011-07-06 ENCOUNTER — Ambulatory Visit: Payer: Self-pay | Admitting: Physical Medicine & Rehabilitation

## 2011-07-06 MED ORDER — CYCLOBENZAPRINE HCL 10 MG PO TABS: 10.0000 mg | ORAL_TABLET | Freq: Two times a day (BID) | ORAL | Status: DC

## 2011-07-06 NOTE — Telephone Encounter (Signed)
Flexeril 10mg  refilled - rx refill request faxed to [facility].

## 2011-07-30 ENCOUNTER — Ambulatory Visit (INDEPENDENT_AMBULATORY_CARE_PROVIDER_SITE_OTHER): Payer: Self-pay | Admitting: Physician Assistant

## 2011-07-30 DIAGNOSIS — F4010 Social phobia, unspecified: Secondary | ICD-10-CM

## 2011-07-30 DIAGNOSIS — F331 Major depressive disorder, recurrent, moderate: Secondary | ICD-10-CM

## 2011-07-30 MED ORDER — FLUOXETINE HCL 60 MG PO TABS: 60.0000 mg | ORAL_TABLET | Freq: Every day | ORAL | Status: DC

## 2011-07-30 NOTE — Progress Notes (Signed)
   [facility] Follow-up Outpatient Visit  Brenda Delacruz 1961-03-09  Date: 07/30/11   Subjective: Brenda Delacruz presents with her husband today to followup on medications for anxiety. She reports that she had a hearing for her disability, and she was turned down for disability, but was approved for SSI in the amount of $168 per month. She disagrees with this determination and plans to appeal for disability. She refuses to answer the question regarding suicidal ideation, stating "what's the point of telling somebody." She goes onto explain that if she was suicidal she would not tell anyone because if she wanted to hurt herself she didn't want anyone to stop her. She reports that she feels hopeless. She endorses homicidal ideation towards a judge and lawyer involved in her disability case, but denies that she has any intent on pursuing any action and that might harm them. She denies any auditory or visual hallucinations. She states that her appetite has increased and that she is eating for comfort from anxiety. She endorses weight gain but does not have an idea of how much. When asked about sleep she states that it's "sometimes good sometimes not."  There were no vitals filed for this visit.  Mental Status Examination  Appearance: Mildly disheveled Alert: Yes Attention: good  Cooperative: No Eye Contact: Poor Speech: Clear and even Psychomotor Activity: Increased Memory/Concentration: Memory impaired/concentration intact Oriented: person, place, time/date and situation Mood: Anxious, Depressed and Irritable Affect: Constricted Thought Processes and Associations: Circumstantial and Disorganized Fund of Knowledge: Good Thought Content: Suicidal ideation, questionably Insight: Fair Judgement: Fair  Diagnosis: Social phobia, major depressive disorder.  Treatment Plan: We will increase her Prozac to 60 mg daily. We will continue her Xanax as prescribed. She is encouraged to return to see  Maxcine Ham. She will followup in 4 weeks.  Huxton Glaus, PA

## 2011-07-31 ENCOUNTER — Ambulatory Visit: Payer: Self-pay | Admitting: Physical Medicine & Rehabilitation

## 2011-08-02 ENCOUNTER — Ambulatory Visit (HOSPITAL_COMMUNITY)
Admission: RE | Admit: 2011-08-02 | Discharge: 2011-08-02 | Disposition: A | Discharge status: Home / Self Care | Payer: Self-pay | Source: Ambulatory Visit | Attending: Family Medicine | Admitting: Family Medicine

## 2011-08-02 ENCOUNTER — Ambulatory Visit (HOSPITAL_COMMUNITY): Payer: Self-pay

## 2011-08-02 DIAGNOSIS — Z1231 Encounter for screening mammogram for malignant neoplasm of breast: Secondary | ICD-10-CM

## 2011-08-03 ENCOUNTER — Ambulatory Visit (HOSPITAL_COMMUNITY): Payer: Self-pay | Admitting: Psychiatry

## 2011-08-08 ENCOUNTER — Ambulatory Visit (HOSPITAL_COMMUNITY): Payer: Self-pay | Admitting: Physician Assistant

## 2011-08-13 ENCOUNTER — Ambulatory Visit (INDEPENDENT_AMBULATORY_CARE_PROVIDER_SITE_OTHER): Payer: Self-pay | Admitting: Psychiatry

## 2011-08-13 DIAGNOSIS — F4010 Social phobia, unspecified: Secondary | ICD-10-CM

## 2011-08-13 NOTE — Progress Notes (Deleted)
    Daily Group Progress Note  Program: {[facility] AMB BH IOP/CDIOP Program Type:21022744}  Group Time:   Participation Level: {CHL AMB BH Group Participation:21022742}  Behavioral Response: {CHL AMB BH Group Behavior:21022743}  Type of Therapy:  {CHL AMB BH Type of Therapy:21022741}  Summary of Progress: ***     Group Time:   Participation Level:  {CHL AMB BH Group Participation:21022742}  Behavioral Response: {CHL AMB BH Group Behavior:21022743}  Type of Therapy: {CHL AMB BH Type of Therapy:21022741}  Summary of Progress: ***  Aleene Swanner E, LCSW 

## 2011-08-13 NOTE — Progress Notes (Signed)
   THERAPIST PROGRESS NOTE  Session Time: 4:00-4:50 pm  Participation Level: Active  Behavioral Response: CasualAlertDepressed  Type of Therapy: Individual Therapy  Treatment Goals addressed: Diagnosis: Social Anxiety Disorder, Depression  Interventions: Strength-based and Supportive  Summary: Brenda Delacruz is a 51 y.o. female who presents with social anxiety disorder and depression. This is writers first session with patient following writers medical leave. Patient reports increased depression and anxiety stemming from the results of her recent disability hearing. Patient expressed frustration and disappointment in how her lawyer handed the case and missed collecting necessary documentation for the hearing. Patient states her lawyer recently notified patient that she was discontinuing her law practice causing patient to seek alternate counsel for the appeal hearing. Patient went through the documentation required by Jorje Guild, PA for the next hearing to gather there thoughts on how to inform him of what is required. Patient states she has been overeating to cope with increased feelings of depression and worry.   Suicidal/Homicidal: Nowithout intent/plan  Therapist Response: Assessed patients overall level of functioning, assessed for suicide due to increased feelings of depression, discussed next steps for appealing disability decision.   Plan: Return again in 1 week.  Diagnosis: Axis I: Anxiety Disorder NOS    Axis II: none    Delmi Fulfer E, LCSW 08/13/2011

## 2011-08-17 ENCOUNTER — Other Ambulatory Visit (HOSPITAL_COMMUNITY): Payer: Self-pay | Admitting: *Deleted

## 2011-08-17 ENCOUNTER — Other Ambulatory Visit (HOSPITAL_COMMUNITY): Payer: Self-pay | Admitting: Psychology

## 2011-08-17 ENCOUNTER — Other Ambulatory Visit: Payer: Self-pay | Admitting: *Deleted

## 2011-08-17 DIAGNOSIS — F4010 Social phobia, unspecified: Secondary | ICD-10-CM

## 2011-08-17 DIAGNOSIS — M549 Dorsalgia, unspecified: Secondary | ICD-10-CM

## 2011-08-17 MED ORDER — ALPRAZOLAM 1 MG PO TABS: ORAL_TABLET | ORAL | Status: DC

## 2011-08-17 MED ORDER — ZOLPIDEM TARTRATE 10 MG PO TABS: 10.0000 mg | ORAL_TABLET | Freq: Every day | ORAL | Status: DC

## 2011-08-18 MED ORDER — CYCLOBENZAPRINE HCL 10 MG PO TABS: 10.0000 mg | ORAL_TABLET | Freq: Two times a day (BID) | ORAL | Status: DC

## 2011-08-20 NOTE — Telephone Encounter (Signed)
Refill faxed in.  

## 2011-08-21 ENCOUNTER — Encounter (HOSPITAL_COMMUNITY): Payer: Self-pay | Admitting: Psychiatry

## 2011-08-21 ENCOUNTER — Ambulatory Visit (INDEPENDENT_AMBULATORY_CARE_PROVIDER_SITE_OTHER): Payer: Self-pay | Admitting: Psychiatry

## 2011-08-21 DIAGNOSIS — F4010 Social phobia, unspecified: Secondary | ICD-10-CM

## 2011-08-21 NOTE — Progress Notes (Signed)
   THERAPIST PROGRESS NOTE  Session Time: 3:00-3:50 pm  Participation Level: Active  Behavioral Response: DisheveledAlertAnxious and Depressed  Type of Therapy: Individual Therapy  Treatment Goals addressed: Diagnosis: Social Anxiety Disorder  Interventions: Supportive and Social Skills Training  Summary: Brenda Delacruz is a 51 y.o. female who presents with depressed mood and affect. She reports an increase in her anxiety and depression symptoms and states her mood has been "up and down" and her sleep has been "poor". Patient is unable to recognize the cause of sleep troubles and requires questioning and exploration to identify anxiety as the trigger. She lacks insight into her triggers and when her symptoms are escalating. She describes feeling "trapped" in her decision to appeal her disability denial in that she wants to quit the process, but feels financially obligated to pursue it. She states if she is denied again she will not do the final federal appeal due to how taxing it has been on her body and in increasing her depression and anxiety.     Suicidal/Homicidal: Nowithout intent/plan  Therapist Response: helped patient gain insight into the level of depression and anxiety she is feeling. Explored triggers for depression and anxiety and provided supportive counseling, reflective listening and reframing.   Plan: Return again in 1 month per patients request. Writer agreed with the commitment by the patient that she would contact writer in between sessions if depression and anxiety symptoms worsen.   Diagnosis: Axis I: Social Anxiety    Axis II: None    [name] E, LCSW 08/21/2011

## 2011-08-27 ENCOUNTER — Ambulatory Visit (HOSPITAL_COMMUNITY): Payer: Self-pay | Admitting: Physician Assistant

## 2011-09-18 ENCOUNTER — Ambulatory Visit (INDEPENDENT_AMBULATORY_CARE_PROVIDER_SITE_OTHER): Payer: Self-pay | Admitting: Psychiatry

## 2011-09-18 DIAGNOSIS — F4010 Social phobia, unspecified: Secondary | ICD-10-CM

## 2011-09-18 NOTE — Progress Notes (Signed)
   THERAPIST PROGRESS NOTE  Session Time: 3:00-3:50 pm  Participation Level: Active  Behavioral Response: Fairly GroomedAlertAnxious  Type of Therapy: Individual Therapy  Treatment Goals addressed: Diagnosis: Soical Anxiety  Interventions: Solution Focused and Supportive  Summary: Brenda Delacruz is a 51 y.o. female who presents with high anxious mood and affect. She has her folder of disability hearing and appeal paperwork and appears nervous and focused on the appeal process. She expresses concerns and worries about gathering the required documentation for the hearing and expresses anger and disappointment about how her case was previously handled by her attourney. She states she feels more comfortable with her current advocate from the [facility] center. She expresses concern about the documentation from writer and her PA not expressing the severity if her anxiety condition. She also described feeling "cursed" in life and how bad things tend to happen to her.   Suicidal/Homicidal: Nowithout intent/plan  Therapist Response: used problem solution skills to identify her need for documentation for her appeal process. Disucussed the process to obtain her records from the medical records department for her appeal, normalized her feelings of stigma associated with others not understanding her anxiety.                                             Plan: Return again in 2 weeks. Continue supporting her through her appeals process and helping her with problem resolution skills to help assist her through this process as well as continued anxiety management skills.   Diagnosis: Axis I: Social Anxiety    Axis II: None    Brenda Delacruz E, LCSW 09/18/2011

## 2011-09-24 ENCOUNTER — Other Ambulatory Visit: Payer: Self-pay | Admitting: *Deleted

## 2011-09-24 DIAGNOSIS — M549 Dorsalgia, unspecified: Secondary | ICD-10-CM

## 2011-09-24 MED ORDER — CYCLOBENZAPRINE HCL 10 MG PO TABS: 10.0000 mg | ORAL_TABLET | Freq: Two times a day (BID) | ORAL | Status: DC

## 2011-09-25 NOTE — Telephone Encounter (Signed)
Flexeril refill -rx reill request form faxed to [facility].

## 2011-10-04 ENCOUNTER — Ambulatory Visit (INDEPENDENT_AMBULATORY_CARE_PROVIDER_SITE_OTHER): Payer: Medicaid Other | Admitting: Physician Assistant

## 2011-10-04 DIAGNOSIS — F4010 Social phobia, unspecified: Secondary | ICD-10-CM | POA: Insufficient documentation

## 2011-10-04 MED ORDER — FLUOXETINE HCL 20 MG PO TABS: 40.0000 mg | ORAL_TABLET | Freq: Every day | ORAL | Status: DC

## 2011-10-04 NOTE — Progress Notes (Signed)
   [facility] Behavioral Health Follow-up Outpatient Visit  Brenda Delacruz [birth year]  Date: 10/04/2011   Subjective: Brenda Delacruz presents today to followup on her medications prescribed for her social phobia. She reports that she continues to work with Carollee Herter and that things are going well. She states that she has been granted SSI at a rate of $243 per month, and she is working with a disability advocate to continue to pursue Social Security disability. She pulled out a folder which included documentation from previous med check visits, and pointed out there was not enough detail in the notes, and asked if an addendum to the notes could be made in order to improve her chances at being granted disability. She feels that her current medications are working well and that her anxiety is well managed. She states that she is taking 40 mg of Prozac daily, rather than the 60 mg that the most recent medication record indicates. She denies any suicidal or homicidal ideation. She denies any auditory or visual hallucinations. She reports that her sleep is poor, but when she was asked to elaborate on that she stated "it doesn't matter anymore" and "I'm just screwed any way."  There were no vitals filed for this visit.  Mental Status Examination  Appearance: Fairly groomed and casually dressed Alert: Yes Attention: good  Cooperative: No, patient became frustrated with provider and ended appointment abruptly Eye Contact: Minimal Speech: Clear and even Psychomotor Activity: Normal Memory/Concentration: Memory is impaired/concentration intact Oriented: person, place, time/date and situation Mood: Anxious and Irritable Affect: Congruent Thought Processes and Associations: Circumstantial Fund of Knowledge: Good Thought Content: Normal Insight: Fair Judgement: Fair  Diagnosis: Social phobia  Treatment Plan: We will continue her Xanax at 1 mg 5 times daily, and reduce her Prozac to 40 mg daily. She stated she will  call for a followup appointment  Jaycee Mckellips, PA-C

## 2011-10-05 ENCOUNTER — Ambulatory Visit (INDEPENDENT_AMBULATORY_CARE_PROVIDER_SITE_OTHER): Payer: Medicaid Other | Admitting: Psychiatry

## 2011-10-05 DIAGNOSIS — F4010 Social phobia, unspecified: Secondary | ICD-10-CM

## 2011-10-05 NOTE — Progress Notes (Signed)
   THERAPIST PROGRESS NOTE  Session Time: 3:00-3:50 pm  Participation Level: Active  Behavioral Response: CasualAlertAnxious  Type of Therapy: Individual Therapy  Treatment Goals addressed: Anxiety  Interventions: Solution Focused and Supportive  Summary: Brenda Delacruz is a 51 y.o. female who presents with slight anxious mood and affect. She describes an interaction she had yesterday that she had with her PA, Jorje Guild and how she regrets how she responded to him regarding her upcoming disability case. She states his direct, confrontational approach helped her to realize her anxiety was very high regarding her upcoming disability and she was "overly focused" on the outcome of the hearing, instead of on focusing on her own wellness. She problem-solved how she wanted to follow up with him and described her great fullness to both him and Clinical research associate for helping her with her anxiety over the years. She states she is going to put away the disability folder that she has been carrying around with her for several months and "obsessing" about - her word and just allow the process to unfold as it needs to. She agreed to allow Clinical research associate to contact [name] at the [facility] (see ROI signed during today's visit) to see what records they need for the upcoming hearing to take patient out of the retrieval process.    Suicidal/Homicidal: Nowithout intent/plan  Therapist Response: used problem solving skills to help patient identify how she wanted to resolve dissatisfaction she has with how she handled her session with her PA yesterday and to determine how to move forward with her upcoming disability hearing. Used Strength Based therapy to help patient see how she is using assertive skills to communicate effectively to her medical providers.   Plan: Return again in 4 weeks.  Diagnosis: Axis I: Social Anxiety    Axis II: None    [name], LCSW 10/05/2011

## 2011-11-06 ENCOUNTER — Ambulatory Visit (HOSPITAL_COMMUNITY): Payer: Medicaid Other | Admitting: Psychiatry

## 2011-11-07 ENCOUNTER — Other Ambulatory Visit: Payer: Self-pay | Admitting: Internal Medicine

## 2011-11-14 ENCOUNTER — Other Ambulatory Visit (HOSPITAL_COMMUNITY): Payer: Self-pay

## 2011-11-14 DIAGNOSIS — F4010 Social phobia, unspecified: Secondary | ICD-10-CM

## 2011-11-14 MED ORDER — ALPRAZOLAM 1 MG PO TABS: ORAL_TABLET | ORAL | Status: DC

## 2011-11-14 MED ORDER — ZOLPIDEM TARTRATE 10 MG PO TABS: 10.0000 mg | ORAL_TABLET | Freq: Every day | ORAL | Status: DC

## 2011-12-10 ENCOUNTER — Other Ambulatory Visit: Payer: Self-pay | Admitting: Internal Medicine

## 2011-12-11 ENCOUNTER — Ambulatory Visit (INDEPENDENT_AMBULATORY_CARE_PROVIDER_SITE_OTHER): Payer: Medicaid Other | Admitting: Psychiatry

## 2011-12-11 ENCOUNTER — Encounter (HOSPITAL_COMMUNITY): Payer: Self-pay | Admitting: Psychiatry

## 2011-12-11 DIAGNOSIS — F4010 Social phobia, unspecified: Secondary | ICD-10-CM

## 2011-12-11 NOTE — Progress Notes (Signed)
   THERAPIST PROGRESS NOTE  Session Time: 3:00-3:50 pm  Participation Level: Active  Behavioral Response: CasualAlertEuthymic  Type of Therapy: Individual Therapy  Treatment Goals addressed: Diagnosis: Social Anxiety  Interventions: Solution Focused and Supportive  Summary: Brenda Delacruz is a 51 y.o. female who presents with euthymic mood and affect. She reports stable mood without any anxiety symptoms over the past month. She provided an update on her progress in treatment and how she has been maintaining stable mood and agreed to move sessions out to attending every three months due to stable mood.    Suicidal/Homicidal: Nowithout intent/plan  Therapist Response: Discussed progress in treatment and mood management strategies. Agreed to begin sessions every three months effective immediatly   Plan: Return again in 3 months.  Diagnosis: Axis I: Social Anxiety    Axis II: None    Vedh Ptacek E, LCSW 12/11/2011

## 2012-01-08 ENCOUNTER — Ambulatory Visit (HOSPITAL_COMMUNITY): Payer: Self-pay | Admitting: Physician Assistant

## 2012-01-10 ENCOUNTER — Ambulatory Visit (INDEPENDENT_AMBULATORY_CARE_PROVIDER_SITE_OTHER): Payer: Medicaid Other | Admitting: Physician Assistant

## 2012-01-10 DIAGNOSIS — F4010 Social phobia, unspecified: Secondary | ICD-10-CM

## 2012-01-10 MED ORDER — FLUOXETINE HCL 20 MG PO TABS: 40.0000 mg | ORAL_TABLET | Freq: Every day | ORAL | Status: DC

## 2012-01-10 NOTE — Progress Notes (Signed)
   [facility] Behavioral Health Follow-up Outpatient Visit  ADER FRITZE May 28, 1961  Date: 01/10/2012   Subjective: [name] presents today to followup on her treatment for social phobia. She states "I'm hanging in there." She continues to await the hearing for her appeal for disability. She reports that her anxiety has been "not too bad." She does complain of some medical problems currently. She is experiencing muscle spasms in her back and she reports her energy is low. She now has Medicaid to help pay for her medications, and states that she can you her Prozac keep her at [facility] so she requested a written prescription. She wants to continue getting her Xanax and Ambien at [facility], as Medicaid will not pay for that. She reports that she is seeing Maxcine Ham every 3 months. She denies any suicidal or homicidal ideation. She denies any auditory or visual hallucinations.  There were no vitals filed for this visit.  Mental Status Examination  Appearance: Fairly groomed and casually dressed Alert: Yes Attention: good  Cooperative: Yes Eye Contact: Minimal Speech: Clear and coherent Psychomotor Activity: Normal Memory/Concentration: Intact Oriented: person, place, time/date and situation Mood: Anxious Affect: Congruent Thought Processes and Associations: Goal Directed Fund of Knowledge: Good Thought Content: Normal Insight: Good Judgement: Good  Diagnosis: Social phobia  Treatment Plan: We will continue her Xanax 1 mg 5 times daily, Ambien 10 mg at bedtime, and Prozac 40 mg daily as prescribed. She will return for followup in 3 months. I recommended ice for her muscle spasms.  Kealohilani Maiorino, PA-C

## 2012-01-18 ENCOUNTER — Other Ambulatory Visit: Payer: Self-pay | Admitting: Internal Medicine

## 2012-02-11 ENCOUNTER — Other Ambulatory Visit (HOSPITAL_COMMUNITY): Payer: Self-pay | Admitting: *Deleted

## 2012-02-11 DIAGNOSIS — F4010 Social phobia, unspecified: Secondary | ICD-10-CM

## 2012-02-11 MED ORDER — ALPRAZOLAM 1 MG PO TABS: ORAL_TABLET | ORAL | Status: DC

## 2012-02-11 MED ORDER — ZOLPIDEM TARTRATE 10 MG PO TABS: 10.0000 mg | ORAL_TABLET | Freq: Every day | ORAL | Status: DC

## 2012-02-26 ENCOUNTER — Other Ambulatory Visit: Payer: Self-pay | Admitting: Internal Medicine

## 2012-02-26 NOTE — Telephone Encounter (Signed)
Needs a clinic appointment.

## 2012-03-12 ENCOUNTER — Ambulatory Visit (INDEPENDENT_AMBULATORY_CARE_PROVIDER_SITE_OTHER): Payer: Medicaid Other | Admitting: Psychiatry

## 2012-03-12 ENCOUNTER — Encounter (HOSPITAL_COMMUNITY): Payer: Self-pay | Admitting: Psychiatry

## 2012-03-12 DIAGNOSIS — F4010 Social phobia, unspecified: Secondary | ICD-10-CM

## 2012-03-12 DIAGNOSIS — F329 Major depressive disorder, single episode, unspecified: Secondary | ICD-10-CM

## 2012-03-12 NOTE — Progress Notes (Signed)
   THERAPIST PROGRESS NOTE  Session Time: 3:00-3:50 pm  Participation Level: Active  Behavioral Response: CasualAlertEuthymic  Type of Therapy: Individual Therapy  Treatment Goals addressed: Coping  Interventions: Supportive  Summary: Brenda Delacruz is a 51 y.o. female who presents with euthymic mood and affect. It has been three months since patients last counseling session, she is on a three month schedule. She reports stable mood and affect. She describes no depression and minimal anxiety due to living a healthy lifestyle and avoiding anxiety triggers. She states she is still awaiting news on appealing her long term disability but feels confident in the process. She provided an update on her family and how she has been functioning for the past three months. She agrees to continue counseling every three months to ensure mood stability and avoid relapse of symptoms.    Suicidal/Homicidal: Nowithout intent/plan  Therapist Response: Provided supportive counseling and reflective listening. Explored stability since last session and discussed a plan going forward to continue mood stability.   Plan: Return again in three months.  Diagnosis: Axis I: Depression, in Remission and Soical anxiety Disorder    Axis II: None    Yianna Tersigni E, LCSW 03/12/2012

## 2012-04-10 ENCOUNTER — Ambulatory Visit (INDEPENDENT_AMBULATORY_CARE_PROVIDER_SITE_OTHER): Payer: PRIVATE HEALTH INSURANCE | Admitting: Physician Assistant

## 2012-04-10 DIAGNOSIS — F4010 Social phobia, unspecified: Secondary | ICD-10-CM

## 2012-04-10 NOTE — Progress Notes (Signed)
   [facility] Behavioral Health Follow-up Outpatient Visit  Brenda Delacruz 1961-02-10  Date: 04/10/2012   Subjective: [name] presents today to followup on her treatment for social anxiety. She reports things are going well. She is still waiting for word on her disability claim, but she is not letting that bother her. She reports that her mood has been stable. She is sleeping well, and her appetite is "too good." She reports that she just stays out of situations that cause her to have higher anxiety, and she is happy that way. She continues to see Maxcine Ham every 3 months.  There were no vitals filed for this visit.  Mental Status Examination  Appearance: Casual Alert: Yes Attention: good  Cooperative: Yes Eye Contact: Fair Speech: Clear and coherent Psychomotor Activity: Restlessness Memory/Concentration: Intact Oriented: person, place, time/date and situation Mood: Anxious Affect: Congruent Thought Processes and Associations: Linear Fund of Knowledge: Good Thought Content: Normal Insight: Good Judgement: Good  Diagnosis: Social phobia  Treatment Plan: We will continue her Xanax 1 mg 5 times daily, Ambien 10 mg at bedtime, and Prozac 40 mg daily as prescribed. She will return for followup in 4 months.  Cass Vandermeulen, PA-C

## 2012-04-19 ENCOUNTER — Other Ambulatory Visit: Payer: Self-pay | Admitting: Internal Medicine

## 2012-05-08 ENCOUNTER — Other Ambulatory Visit: Payer: Self-pay | Admitting: Internal Medicine

## 2012-05-09 NOTE — Telephone Encounter (Signed)
I agree that patient needs to be seen, as noted by Dr. Lonzo Cloud when she last refilled this medication in September.

## 2012-05-09 NOTE — Telephone Encounter (Signed)
Pt has not been seen over a year; attempted to call pt - "not accepting calls". I called the pharmacy and was given a current cell telephone#; No answer - message left to call the clinic to schedule an appt.

## 2012-05-12 ENCOUNTER — Other Ambulatory Visit (HOSPITAL_COMMUNITY): Payer: Self-pay | Admitting: *Deleted

## 2012-05-12 ENCOUNTER — Encounter: Payer: Self-pay | Admitting: Internal Medicine

## 2012-05-12 DIAGNOSIS — F4010 Social phobia, unspecified: Secondary | ICD-10-CM

## 2012-05-12 MED ORDER — ZOLPIDEM TARTRATE 10 MG PO TABS: 10.0000 mg | ORAL_TABLET | Freq: Every day | ORAL | Status: DC

## 2012-05-12 MED ORDER — ALPRAZOLAM 1 MG PO TABS
ORAL_TABLET | ORAL | Status: DC
Start: 2012-05-12 — End: 2012-08-07

## 2012-05-20 ENCOUNTER — Other Ambulatory Visit (HOSPITAL_COMMUNITY): Payer: Self-pay | Admitting: Physician Assistant

## 2012-05-20 ENCOUNTER — Other Ambulatory Visit: Payer: Self-pay | Admitting: Internal Medicine

## 2012-05-20 DIAGNOSIS — F4010 Social phobia, unspecified: Secondary | ICD-10-CM

## 2012-06-11 ENCOUNTER — Encounter: Payer: Self-pay | Admitting: Internal Medicine

## 2012-06-11 ENCOUNTER — Ambulatory Visit (INDEPENDENT_AMBULATORY_CARE_PROVIDER_SITE_OTHER): Payer: Self-pay | Admitting: Internal Medicine

## 2012-06-11 ENCOUNTER — Other Ambulatory Visit (HOSPITAL_COMMUNITY)
Admission: RE | Admit: 2012-06-11 | Discharge: 2012-06-11 | Disposition: A | Discharge status: Home / Self Care | Payer: Medicaid Other | Source: Ambulatory Visit | Attending: Internal Medicine | Admitting: Internal Medicine

## 2012-06-11 VITALS — BP 150/94 | HR 79 | Wt 240.1 lb

## 2012-06-11 DIAGNOSIS — I10 Essential (primary) hypertension: Secondary | ICD-10-CM

## 2012-06-11 DIAGNOSIS — N760 Acute vaginitis: Secondary | ICD-10-CM | POA: Insufficient documentation

## 2012-06-11 DIAGNOSIS — Z01419 Encounter for gynecological examination (general) (routine) without abnormal findings: Secondary | ICD-10-CM | POA: Insufficient documentation

## 2012-06-11 DIAGNOSIS — Z124 Encounter for screening for malignant neoplasm of cervix: Secondary | ICD-10-CM

## 2012-06-11 DIAGNOSIS — M62838 Other muscle spasm: Secondary | ICD-10-CM

## 2012-06-11 DIAGNOSIS — Z113 Encounter for screening for infections with a predominantly sexual mode of transmission: Secondary | ICD-10-CM | POA: Insufficient documentation

## 2012-06-11 DIAGNOSIS — N898 Other specified noninflammatory disorders of vagina: Secondary | ICD-10-CM

## 2012-06-11 DIAGNOSIS — Z23 Encounter for immunization: Secondary | ICD-10-CM

## 2012-06-11 DIAGNOSIS — Z0000 Encounter for general adult medical examination without abnormal findings: Secondary | ICD-10-CM | POA: Insufficient documentation

## 2012-06-11 LAB — LIPID PANEL
HDL: 62 mg/dL (ref >39)
LDL Cholesterol: 108 mg/dL — ABNORMAL HIGH (ref 0–99)
Triglycerides: 88 mg/dL (ref <150)

## 2012-06-11 LAB — ANEMIA PANEL
%SAT: 21 % (ref 20–55)
Folate: 5.7 ng/mL
TIBC: 464 ug/dL (ref 250–470)
UIBC: 366 ug/dL (ref 125–400)
Vitamin B-12: 331 pg/mL (ref 211–911)

## 2012-06-11 MED ORDER — HYDROCHLOROTHIAZIDE 25 MG PO TABS: 25.0000 mg | ORAL_TABLET | Freq: Every day | ORAL | Status: DC

## 2012-06-11 MED ORDER — THERA VITAL M PO TABS: 1.0000 | ORAL_TABLET | Freq: Every day | ORAL | Status: AC

## 2012-06-11 NOTE — Assessment & Plan Note (Signed)
Blood pressure continues to be elevated. At today's visit is 150/94. Patient has been tried on lifestyle change attempts and appears to  need some pharmacologic intervention. Therefore, we will start her on HCTZ. Discussed risks and benefits as well as potential side effects of this medication. Patient is agreeable to starting this new medicine for blood pressure.  -Patient given prescription for HCTZ 25 mg daily -Patient was given hypertension reading material during this visit -Followup in clinic for blood pressure check in 6 weeks -In the meantime, the patient knows to call our clinic if she has any problems or questions about this new medication

## 2012-06-11 NOTE — Patient Instructions (Signed)
Please return to clinic in 6 weeks for blood pressure check.

## 2012-06-11 NOTE — Assessment & Plan Note (Signed)
Patient is due for a Pap smear. Her mom is recent diagnosis of cervical cancer, so she is particularly worried at this visit. Pap smear was performed and cytology sent off. Will also check for chlamydia and gonorrhea.  Flu shot also administered during this visit.  Started on multivitamin

## 2012-06-11 NOTE — Assessment & Plan Note (Signed)
Today patient is complaining of episodic muscle spasms that started in her neck and jaw and radiate to her chest, back, arms. This complaint did not appear to be cardiac in nature as it does not get worse with exertion and the patient describes muscle spasms during these episodes. These episodes are also chronic in nature. Will rule out electrolyte abnormalities, vitamin deficiency. Most likely musculoskeletal  -Will check CMP, mag, anemia panel -start OTC multivitamin

## 2012-06-11 NOTE — Progress Notes (Signed)
Internal Medicine Clinic Visit    HPI:  Brenda Delacruz is a 52 y.o. year old female with a history of chronic back pain, anxiety, depression, GERD, who presents for a general physical exam and followup.  Patient states that she would like a pap smear and flu shot today. Her mother was recently diagnosed with cervical cancer so she is doing to be current with her Paps. Last PAP was 2 years ago and normal.   She also reports having muscle spasms, that start in her neck and jaw, radiates to upper back and chest and shoulders. Usually takes Flexeril which helps. Happens randomly, no associated symtpoms of shortness of breath, diaphoresis, numbness or tingling. Not worse with physical activity. Usually has her husband pounds on her back to make it better. She does take Flexeril on a daily basis and often takes an additional pill when she has these spasms. She denies an unsteady gait, numbness, tingling. She does have a history of peripheral neuropathy, however, this has improved since her last visit.  Of note, patient's pain contract was terminated at the last visit and she has not received refills for her narcotics since that time. She states she is not taking narcotics anymore and instead using Tylenol and ibuprofen for her back pain.  Admits to some depressed mood, and she follows for depression and anxiety by behavioral health.  Does not have a personal history of CAD, stroke.  No family history of heart attack or heart failure.  Past medical history reviewed.   Past Medical History  Diagnosis Date  . Anemia   . Anxiety   . Depression   . Gallstones   . GERD (gastroesophageal reflux disease)   . Obesity     Past Surgical History  Procedure Date  . Cholecystectomy      ROS:  A complete review of systems was otherwise negative, except as noted in the HPI.  Allergies: Review of patient's allergies indicates no known allergies.  Medications: Current Outpatient Prescriptions    Medication Sig Dispense Refill  . ALPRAZolam (XANAX) 1 MG tablet Take one tablet by mouth five times daily  450 tablet  0  . cyclobenzaprine (FLEXERIL) 10 MG tablet TAKE 1 TABLET TWICE A DAY  60 tablet  0  . FLUoxetine (PROZAC) 20 MG tablet TAKE 2 TABLETS BY MOUTH DAILY  60 tablet  2  . omeprazole (PRILOSEC) 20 MG capsule TAKE 1 CAPSULE (20 MG TOTAL) BY MOUTH DAILY.  30 capsule  0  . zolpidem (AMBIEN) 10 MG tablet Take 1 tablet (10 mg total) by mouth at bedtime.  90 tablet  0  . ferrous gluconate (FERGON) 246 (28 FE) MG tablet Take 1 tablet (246 mg total) by mouth 2 (two) times daily with a meal.  60 tablet  6  . hydrochlorothiazide (HYDRODIURIL) 25 MG tablet Take 1 tablet (25 mg total) by mouth daily.  30 tablet  6  . hydrochlorothiazide 12.5 MG capsule Take 1 capsule (12.5 mg total) by mouth daily.  30 capsule  2  . Multiple Vitamins-Minerals (MULTIVITAMIN) tablet Take 1 tablet by mouth daily.        History   Social History  . Marital Status: Married    Spouse Name: N/A    Number of Children: N/A  . Years of Education: N/A   Occupational History  . Not on file.   Social History Main Topics  . Smoking status: Former Smoker    Quit date: 07/17/1993  . Smokeless tobacco:  Not on file  . Alcohol Use: No  . Drug Use: No  . Sexually Active: Not on file   Other Topics Concern  . Not on file   Social History Narrative   Financial assistance approved for [facility]% discount at [facility] and [facility] March [facility].Regular exercise- no.    family history includes Diabetes in an unspecified family member and Hypertension in an unspecified family member.  Physical Exam Blood pressure 150/94, pulse 79, weight 240 lb 1.6 oz (108.909 kg), SpO2 100.00%. General:  No acute distress, obese, alert and oriented x 3 HEENT:  PERRL, EOMI, moist mucous membranes Cardiovascular:  Regular rate and rhythm, no murmurs Respiratory:  Clear to auscultation bilaterally, no wheezes, rales, or  rhonchi Abdomen:  Soft, nondistended, nontender, positive bowel sounds Extremities:  Warm and well-perfused, no clubbing, cyanosis, or edema.  Skin: Warm, dry, no rashes Neuro: Blunted affect. MSK: Entire spine is slightly tender to palpation. Strength is intact, sensation is intact. Muscles do not appear to be contracted or tense at the time of examination. GU: No external lesions noted during Pap smear. No internal lesions. Scant whitish discharge noted.  Labs: Lab Results  Component Value Date   CREATININE 0.69 09/15/2010   BUN 7 09/15/2010   NA 139 09/15/2010   K 4.1 09/15/2010   CL 104 09/15/2010   CO2 25 09/15/2010   Lab Results  Component Value Date   WBC 5.2 09/15/2010   HGB 10.4* 09/15/2010   HCT 33.6* 09/15/2010   MCV 80.4 09/15/2010   PLT 327 09/15/2010      Assessment and Plan:    FOLLOWUP: Brenda Delacruz will follow back up in our clinic in approximately  6 weeks. Brenda Delacruz knows to call out clinic in the meantime with any questions or new issues.

## 2012-06-12 ENCOUNTER — Ambulatory Visit (HOSPITAL_COMMUNITY): Payer: Self-pay | Admitting: Psychiatry

## 2012-06-12 LAB — URINALYSIS, ROUTINE W REFLEX MICROSCOPIC
Bilirubin Urine: NEGATIVE
Hgb urine dipstick: NEGATIVE
Ketones, ur: NEGATIVE mg/dL
Protein, ur: NEGATIVE mg/dL
Urobilinogen, UA: 0.2 mg/dL (ref 0.0–1.0)

## 2012-06-12 LAB — COMPLETE METABOLIC PANEL WITH GFR
Albumin: 4.2 g/dL (ref 3.5–5.2)
Alkaline Phosphatase: 61 U/L (ref 39–117)
BUN: 9 mg/dL (ref 6–23)
GFR, Est African American: >89 mL/min
GFR, Est Non African American: >89 mL/min
Glucose, Bld: 79 mg/dL (ref 70–99)
Total Bilirubin: 0.4 mg/dL (ref 0.3–1.2)

## 2012-06-21 ENCOUNTER — Other Ambulatory Visit: Payer: Self-pay | Admitting: Internal Medicine

## 2012-06-24 NOTE — Telephone Encounter (Signed)
Has Feb 2014 appt with PCP to F/U BP

## 2012-06-25 ENCOUNTER — Other Ambulatory Visit: Payer: Self-pay | Admitting: Internal Medicine

## 2012-07-15 ENCOUNTER — Other Ambulatory Visit: Payer: Self-pay | Admitting: *Deleted

## 2012-07-20 MED ORDER — CYCLOBENZAPRINE HCL 10 MG PO TABS: ORAL_TABLET | ORAL | Status: DC

## 2012-07-28 ENCOUNTER — Ambulatory Visit (INDEPENDENT_AMBULATORY_CARE_PROVIDER_SITE_OTHER): Payer: Medicaid Other | Admitting: Internal Medicine

## 2012-07-28 VITALS — BP 136/82 | HR 85 | Temp 97.0°F | Ht 65.0 in | Wt 230.8 lb

## 2012-07-28 DIAGNOSIS — I10 Essential (primary) hypertension: Secondary | ICD-10-CM

## 2012-07-28 MED ORDER — HYDROCORTISONE ACETATE 25 MG RE SUPP: 25.0000 mg | Freq: Two times a day (BID) | RECTAL | Status: AC

## 2012-07-28 NOTE — Assessment & Plan Note (Signed)
BP Readings from Last 3 Encounters:  07/28/12 136/82  06/11/12 150/94  04/02/11 127/89    Lab Results  Component Value Date   NA 137 06/11/2012   K 4.3 06/11/2012   CREATININE 0.73 06/11/2012      Patient presents six weeks after starting HCTZ 25 mg daily. Patient states that she has been taking her medication as prescribed and today her blood pressure is much improved. She has not had any side effects of HCTZ that she is complaining of.  -Chek BMP today -Continue HCTZ 25 mg

## 2012-07-28 NOTE — Progress Notes (Signed)
Internal Medicine Clinic Visit    HPI:  Brenda Delacruz is a 52 y.o. year old female with a history of chronic back pain, anxiety, depression, GERD, who presents for followup and blood pressure check.  Since last visit, patient has been taking her blood pressure medicines every day. She denies any symptoms of headache, chest pain, shortness of breath.  She is also complaining of hemorrhoids that are bothersome and prolapse during defecation. They also occasionally bleed.They are bothering her more than usual and she asks for a prescription. She has tried stool softener.  During her last visit, we discussed how the patient's mom was recently diagnosed with stage IV cervical cancer. It was seen that she is inoperable and her family has struggled to come to grips with this. The patient seems to be coping as well as can be expected. She denies any suicidal ideations. She says her mood could be better but she is doing okay.she states that she is seeing her therapist this Thursday as well as her psychiatrist next month.  Patient continues to have muscle spasms in jaw and teeth as we discussed during the previous visit. She finds that her symptoms improve shortly after drinking a glass of cold water and taking a Flexeril. These episodes last from 5-10 minutes and radiate to her chest and back.  Does not have a personal history of CAD, stroke.  No family history of heart attack or heart failure.  Past medical history reviewed.   Past Medical History  Diagnosis Date  . Anemia   . Anxiety   . Depression   . Gallstones   . GERD (gastroesophageal reflux disease)   . Obesity     Past Surgical History  Procedure Laterality Date  . Cholecystectomy       ROS:  A complete review of systems was otherwise negative, except as noted in the HPI.  Allergies: Review of patient's allergies indicates no known allergies.  Medications: Current Outpatient Prescriptions  Medication Sig Dispense Refill   . ALPRAZolam (XANAX) 1 MG tablet Take one tablet by mouth five times daily  450 tablet  0  . cyclobenzaprine (FLEXERIL) 10 MG tablet Take one tab twice per day only as needed for muscle spasm  40 tablet  0  . ferrous gluconate (FERGON) 246 (28 FE) MG tablet Take 1 tablet (246 mg total) by mouth 2 (two) times daily with a meal.  60 tablet  6  . FLUoxetine (PROZAC) 20 MG tablet TAKE 2 TABLETS BY MOUTH DAILY  60 tablet  2  . hydrochlorothiazide (HYDRODIURIL) 25 MG tablet Take 1 tablet (25 mg total) by mouth daily.  30 tablet  6  . hydrochlorothiazide 12.5 MG capsule Take 1 capsule (12.5 mg total) by mouth daily.  30 capsule  2  . Multiple Vitamins-Minerals (MULTIVITAMIN) tablet Take 1 tablet by mouth daily.      Marland Kitchen omeprazole (PRILOSEC) 20 MG capsule TAKE 1 CAPSULE (20 MG TOTAL) BY MOUTH DAILY.  30 capsule  11  . omeprazole (PRILOSEC) 20 MG capsule TAKE 1 CAPSULE (20 MG TOTAL) BY MOUTH DAILY.  30 capsule  0  . zolpidem (AMBIEN) 10 MG tablet Take 1 tablet (10 mg total) by mouth at bedtime.  90 tablet  0   No current facility-administered medications for this visit.    History   Social History  . Marital Status: Married    Spouse Name: N/A    Number of Children: N/A  . Years of Education: N/A  Occupational History  . Not on file.   Social History Main Topics  . Smoking status: Former Smoker    Quit date: 07/17/1993  . Smokeless tobacco: Not on file  . Alcohol Use: No  . Drug Use: No  . Sexually Active: Not on file   Other Topics Concern  . Not on file   Social History Narrative   Financial assistance approved for 100% discount at [facility] and has [facility] card   D [facility] March 23,2011.      Regular exercise- no.    family history includes Diabetes in an unspecified family member and Hypertension in an unspecified family member.  Physical Exam Blood pressure 136/82, pulse 85, temperature 97 F (36.1 C), temperature source Oral, height 5\' 5"  (1.651 m), weight 230 lb 12.8 oz (104.69  kg), SpO2 97.00%. General:  No acute distress, obese, alert and oriented x 3 HEENT:  PERRL, EOMI, moist mucous membranes Cardiovascular:  Regular rate and rhythm, no murmurs Respiratory:  Clear to auscultation bilaterally, no wheezes, rales, or rhonchi Abdomen:  Soft, nondistended, nontender, positive bowel sounds Extremities:  Warm and well-perfused, no clubbing, cyanosis, or edema.  Skin: Warm, dry, no rashes Neuro: normal affect, occasional repetitive motions with her hands, avoids prolonged eye contact   Labs: Lab Results  Component Value Date   CREATININE 0.73 06/11/2012   BUN 9 06/11/2012   NA 137 06/11/2012   K 4.3 06/11/2012   CL 101 06/11/2012   CO2 28 06/11/2012   Lab Results  Component Value Date   WBC 5.2 09/15/2010   HGB 10.4* 09/15/2010   HCT 33.6* 09/15/2010   MCV 80.4 09/15/2010   PLT 327 09/15/2010      Assessment and Plan:    FOLLOWUP: Brenda Delacruz will follow back up in our clinic in approximately 3-4 months. Brenda Delacruz knows to call our clinic in the meantime with any questions or new issues.

## 2012-07-28 NOTE — Assessment & Plan Note (Signed)
Patient presents with painful hemorrhoids that occasionally bleed. She asked for a prescription for a Anusol which she has used in the past with good results.  -rx for anusol given

## 2012-07-28 NOTE — Assessment & Plan Note (Signed)
Patient continues to have episodic muscle spasms as previously described. They always start in her neck and jaw and radiates to chest, back, arms. Random onset, not worse with activity, no isolated chest pain or shortness of breath. Labs taken last visit ruled out large abnormalities, magnesium normal. She has been taking an over-the-counter vitamin as suggested at the last visit. She states that the spasm improves almost immediately when she drinks a glass of cold water and takes the Flexeril.  -Refill given for Flexeril -Continue multivitamin

## 2012-07-28 NOTE — Patient Instructions (Signed)
Please return to clinic in 3-4 months, sooner if needed.  Please pick up your medications from your pharmacy.

## 2012-07-29 ENCOUNTER — Ambulatory Visit (HOSPITAL_COMMUNITY): Payer: Self-pay | Admitting: Psychiatry

## 2012-07-29 LAB — BASIC METABOLIC PANEL WITH GFR
CO2: 28 mEq/L (ref 19–32)
Calcium: 9.7 mg/dL (ref 8.4–10.5)
Chloride: 102 mEq/L (ref 96–112)
Potassium: 3.8 mEq/L (ref 3.5–5.3)
Sodium: 140 mEq/L (ref 135–145)

## 2012-08-07 ENCOUNTER — Ambulatory Visit (INDEPENDENT_AMBULATORY_CARE_PROVIDER_SITE_OTHER): Payer: Federal, State, Local not specified - Other | Admitting: Physician Assistant

## 2012-08-07 DIAGNOSIS — F4010 Social phobia, unspecified: Secondary | ICD-10-CM

## 2012-08-07 MED ORDER — ZOLPIDEM TARTRATE 10 MG PO TABS: 10.0000 mg | ORAL_TABLET | Freq: Every day | ORAL | Status: DC

## 2012-08-07 MED ORDER — FLUOXETINE HCL 20 MG PO TABS: 40.0000 mg | ORAL_TABLET | Freq: Every day | ORAL | Status: DC

## 2012-08-07 MED ORDER — ALPRAZOLAM 1 MG PO TABS: ORAL_TABLET | ORAL | Status: DC

## 2012-08-07 NOTE — Progress Notes (Signed)
   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 She reports that things are crazy, and it has been a rough ride. She explains that her mother has been diagnosed with stage IV cervical cancer that has metastasized to her lymph nodes and lungs. Currently she is being treated with palliative care. Otherwise, she'll reports her anxiety is well managed. She is sleeping and eating well. She has no suicidal or homicidal ideation. She has no auditory or visual hallucinations.  There were no vitals filed for this visit.  Mental Status Examination  Appearance: Casual Alert: Yes Attention: good  Cooperative: Yes Eye Contact: Fair Speech: Clear and coherent Psychomotor Activity: Normal Memory/Concentration: Intact Oriented: person, place, time/date and situation Mood: Dysphoric Affect: Congruent Thought Processes and Associations: Linear Fund of Knowledge: Good Thought Content: Normal Insight: Good Judgement: Good  Diagnosis: Social phobia  Treatment Plan: We will continue her Xanax 1 mg 5 times daily, Ambien 10 mg at bedtime, and Prozac 40 mg daily as prescribed. She will return for followup in 3 months.  Hatley Henegar, PA-C

## 2012-08-13 ENCOUNTER — Ambulatory Visit (INDEPENDENT_AMBULATORY_CARE_PROVIDER_SITE_OTHER): Payer: Self-pay | Admitting: Psychiatry

## 2012-08-13 DIAGNOSIS — F4010 Social phobia, unspecified: Secondary | ICD-10-CM

## 2012-08-13 NOTE — Progress Notes (Signed)
   THERAPIST PROGRESS NOTE  Session Time: 3:00-3:50 pm  Participation Level: Active  Behavioral Response: CasualAlertAnxious  Type of Therapy: Individual Therapy  Treatment Goals addressed: Coping  Interventions: Supportive  Summary: Brenda Delacruz is a 52 y.o. female who presents with anxious mood and affect. She present as stable and calm for her normal level of functioning and reports her anxiety stemming from learning two months ago that her mother has stage four cervical cancer. Patient states her mother moved into her home so patient can act as primary caretaker. Patient used the session to process this transition and anger she has towards her sister for her lack of support with the situation. Despite the amount of stress patient is under, she is managing very well. She agreed to increase the frequency of the sessions due to this recent life crisis.    Suicidal/Homicidal: Nowithout intent/plan  Therapist Response: Provided supportive counseling  Plan: Return again in 2 weeks.  Diagnosis: Axis I: Soical Phobia    Axis II: No diagnosis    Carman Ching, LCSW 08/13/2012

## 2012-08-28 ENCOUNTER — Other Ambulatory Visit: Payer: Self-pay | Admitting: Internal Medicine

## 2012-09-09 ENCOUNTER — Other Ambulatory Visit: Payer: Self-pay | Admitting: Internal Medicine

## 2012-09-18 ENCOUNTER — Other Ambulatory Visit: Payer: Self-pay | Admitting: Internal Medicine

## 2012-09-18 DIAGNOSIS — Z1231 Encounter for screening mammogram for malignant neoplasm of breast: Secondary | ICD-10-CM

## 2012-09-29 ENCOUNTER — Ambulatory Visit (HOSPITAL_COMMUNITY): Payer: Self-pay

## 2012-09-29 ENCOUNTER — Ambulatory Visit (HOSPITAL_COMMUNITY): Payer: Self-pay | Admitting: Psychiatry

## 2012-10-23 ENCOUNTER — Ambulatory Visit: Payer: Self-pay | Admitting: Internal Medicine

## 2012-10-24 ENCOUNTER — Ambulatory Visit (HOSPITAL_COMMUNITY)
Admission: RE | Admit: 2012-10-24 | Discharge: 2012-10-24 | Disposition: A | Discharge status: Home / Self Care | Payer: Medicaid Other | Source: Ambulatory Visit | Attending: Internal Medicine | Admitting: Internal Medicine

## 2012-10-24 ENCOUNTER — Encounter: Payer: Self-pay | Admitting: Internal Medicine

## 2012-10-24 ENCOUNTER — Ambulatory Visit (INDEPENDENT_AMBULATORY_CARE_PROVIDER_SITE_OTHER): Payer: Medicaid Other | Admitting: Internal Medicine

## 2012-10-24 ENCOUNTER — Ambulatory Visit: Payer: Self-pay | Admitting: Internal Medicine

## 2012-10-24 VITALS — BP 129/85 | HR 68 | Temp 97.7°F | Ht 65.0 in | Wt 227.0 lb

## 2012-10-24 DIAGNOSIS — H538 Other visual disturbances: Secondary | ICD-10-CM

## 2012-10-24 DIAGNOSIS — R059 Cough, unspecified: Secondary | ICD-10-CM | POA: Insufficient documentation

## 2012-10-24 DIAGNOSIS — R079 Chest pain, unspecified: Secondary | ICD-10-CM | POA: Insufficient documentation

## 2012-10-24 DIAGNOSIS — R0602 Shortness of breath: Secondary | ICD-10-CM | POA: Insufficient documentation

## 2012-10-24 DIAGNOSIS — R05 Cough: Secondary | ICD-10-CM

## 2012-10-24 DIAGNOSIS — I10 Essential (primary) hypertension: Secondary | ICD-10-CM | POA: Insufficient documentation

## 2012-10-24 MED ORDER — CHLORPHENIRAMINE MALEATE 4 MG PO TABS: 4.0000 mg | ORAL_TABLET | Freq: Two times a day (BID) | ORAL | Status: DC | PRN

## 2012-10-24 MED ORDER — AZITHROMYCIN 250 MG PO TABS: ORAL_TABLET | ORAL | Status: DC

## 2012-10-24 NOTE — Progress Notes (Signed)
Subjective:   Patient ID: Brenda Delacruz female   DOB: 03/01/1961 53 y.o.   MRN: [id number]  HPI: [name] is a 52 y.o. female with past medical history significant as outlined below who presented to the clinic with productive cough with yellow and green sputum. Patient denies any fevers noted sweating and feeling tired. She tried Mucinex and Advil without any significant improvement. Patient noted it started off 3 weeks ago and has been persistent. Initially she had been excuse significant cough which now has somewhat improved. She further complains about nasal congestion and fullness in her ears as well as sore throat. Her husband and her son are sick, too.     Past Medical History  Diagnosis Date  . Anemia   . Anxiety   . Depression   . Gallstones   . GERD (gastroesophageal reflux disease)   . Obesity    Current Outpatient Prescriptions  Medication Sig Dispense Refill  . ALPRAZolam (XANAX) 1 MG tablet Take one tablet by mouth five times daily  450 tablet  0  . cyclobenzaprine (FLEXERIL) 10 MG tablet TAKE ONE TAB TWICE PER DAY ONLY AS NEEDED FOR MUSCLE SPASM  40 tablet  0  . cyclobenzaprine (FLEXERIL) 10 MG tablet TAKE ONE TAB TWICE PER DAY ONLY AS NEEDED FOR MUSCLE SPASM  40 tablet  3  . ferrous gluconate (FERGON) 246 (28 FE) MG tablet Take 1 tablet (246 mg total) by mouth 2 (two) times daily with a meal.  60 tablet  6  . FLUoxetine (PROZAC) 20 MG tablet Take 2 tablets (40 mg total) by mouth daily.  60 tablet  2  . hydrochlorothiazide (HYDRODIURIL) 25 MG tablet Take 1 tablet (25 mg total) by mouth daily.  30 tablet  6  . hydrochlorothiazide 12.5 MG capsule Take 1 capsule (12.5 mg total) by mouth daily.  30 capsule  2  . hydrocortisone (ANUSOL-HC) 25 MG suppository Place 1 suppository (25 mg total) rectally every 12 (twelve) hours.  12 suppository  3  . Multiple Vitamins-Minerals (MULTIVITAMIN) tablet Take 1 tablet by mouth daily.      Marland Kitchen omeprazole (PRILOSEC) 20 MG capsule  TAKE 1 CAPSULE (20 MG TOTAL) BY MOUTH DAILY.  30 capsule  11  . zolpidem (AMBIEN) 10 MG tablet Take 1 tablet (10 mg total) by mouth at bedtime.  90 tablet  0   No current facility-administered medications for this visit.   Family History  Problem Relation Age of Onset  . Diabetes      family histoy  . Hypertension      family history   History   Social History  . Marital Status: Married    Spouse Name: N/A    Number of Children: N/A  . Years of Education: N/A   Social History Main Topics  . Smoking status: Former Smoker    Quit date: 07/17/1993  . Smokeless tobacco: None  . Alcohol Use: No  . Drug Use: No  . Sexually Active: None   Other Topics Concern  . None   Social History Narrative   Financial assistance approved for 100% discount at [facility] and has [facility] card   [facility] March 23,2011.      Regular exercise- no.   Review of Systems: Constitutional:  Noted  chills and fatigue but denies fever, diaphoresis, appetite change and fatigue.  HEENT: Noted  congestion, sore throat, rhinorrhea but denies , mouth sores, trouble swallowing, neck pain, neck stiffness and tinnitus.   Respiratory: Noted  SOB, DOE, cough, chest tightness, Cardiovascular: Denies chest pain, palpitations and leg swelling.  Gastrointestinal: Denies nausea, vomiting, abdominal pain, diarrhea, constipation, blood in stool and abdominal distention.  Genitourinary: Denies dysuria, urgency, frequency, hematuria, flank pain and difficulty urinating.  Skin: Denies pallor, rash and wound.  Neurological: Denies dizziness Hematological: Denies adenopathy.   Objective:  Physical Exam: Filed Vitals:   10/24/12 1436  BP: 129/85  Pulse: 68  Temp: 97.7 F (36.5 C)  TempSrc: Oral  Height: 5\' 5"  (1.651 m)  Weight: 227 lb (102.967 kg)  SpO2: 98%   Constitutional: Vital signs reviewed.  Patient is a well-developed and well-nourished female in no acute distress and cooperative with exam. Alert and oriented x3.   Eyes: PERRL, EOMI, conjunctivae normal, No scleral icterus.  Ears: Tympanic membrane normal bilaterally Mouth: No erythema. Mucous membranes moist Neck: Supple,   Cardiovascular: RRR, S1 normal, S2 normal, no MRG, pulses symmetric and intact bilaterally Pulmonary/Chest: Good air movement except rhonchi at the left base. No wheezing present Abdominal: Soft. Non-tender, non-distended, bowel sounds are normal, Hematology: no cervical,adenopathy.  Neurological: A&O x3,  Skin: Warm, dry and intact. No rash, cyanosis, or clubbing.

## 2012-10-24 NOTE — Patient Instructions (Signed)
If you experiencing worsening  Cough, Shortness of breath please go the the emergency room  Drink plenty of water.

## 2012-10-24 NOTE — Assessment & Plan Note (Addendum)
We will start patient on azithromycin and chlorpheniramine for cough. Will obtain chest x-ray. Instructed the patient if she 60 worsening cough, shortness of breath she needs to be evaluated in the emergency room as soon as possible.

## 2012-10-28 NOTE — Progress Notes (Signed)
Case discussed with Dr. Illath immediately after the resident saw the patient. We reviewed the resident's history and exam and pertinent patient test results. I agree with the assessment, diagnosis and plan of care documented in the resident's note. 

## 2012-11-10 ENCOUNTER — Ambulatory Visit (INDEPENDENT_AMBULATORY_CARE_PROVIDER_SITE_OTHER): Payer: Federal, State, Local not specified - Other | Admitting: Physician Assistant

## 2012-11-10 ENCOUNTER — Encounter (HOSPITAL_COMMUNITY): Payer: Self-pay | Admitting: Physician Assistant

## 2012-11-10 VITALS — BP 138/86 | HR 68 | Ht 65.0 in | Wt 227.2 lb

## 2012-11-10 DIAGNOSIS — F4010 Social phobia, unspecified: Secondary | ICD-10-CM

## 2012-11-11 MED ORDER — ZOLPIDEM TARTRATE 10 MG PO TABS: 10.0000 mg | ORAL_TABLET | Freq: Every evening | ORAL | Status: DC | PRN

## 2012-11-11 MED ORDER — ALPRAZOLAM 1 MG PO TABS: ORAL_TABLET | ORAL | Status: DC

## 2012-11-11 NOTE — Progress Notes (Signed)
[facility] [id number] Progress Note  LOVEAH LIKE [id number] 52 y.o.  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 When asked how she is doing she states "it's been rough." Her mother died on 05-18-24and her mother's birthday is this week. She reports that she has been having some anxiety, but denies any recent panic attacks. Her sleep and appetite have been good. She is experiencing normal grief. She, her husband, and her son have all been well recently with upper respiratory infections, and she experienced a fever of 103F. She denies any suicidal or homicidal ideation. She denies any auditory or visual hallucinations.  Suicidal Ideation: No Plan Formed: No Patient has means to carry out plan: No  Homicidal Ideation: No Plan Formed: No Patient has means to carry out plan: No  Review of Systems: Psychiatric: Agitation: No Hallucination: No Depressed Mood: Yes Insomnia: No Hypersomnia: No Altered Concentration: No Feels Worthless: No Grandiose Ideas: No Belief In Special Powers: No New/Increased Substance Abuse: No Compulsions: No  Neurologic: Headache: No Seizure: No Paresthesias: No  Past Medical History: Obesity, hypertension, GERD, chronic back pain, plantar fasciitis  Outpatient Encounter Prescriptions as of 11/10/2012  Medication Sig Dispense Refill  . ALPRAZolam (XANAX) 1 MG tablet Take one tablet by mouth five times daily  450 tablet  0  . azithromycin (ZITHROMAX) 250 MG tablet Take 2 tablets today. Then take one tablet every day for 4 days and then stop  6 each  0  . chlorpheniramine (CHLORPHEN) 4 MG tablet Take 1 tablet (4 mg total) by mouth 2 (two) times daily as needed for allergies.  14 tablet  0  . cyclobenzaprine (FLEXERIL) 10 MG tablet TAKE ONE TAB TWICE PER DAY ONLY AS NEEDED FOR MUSCLE SPASM  40 tablet  3  . ferrous gluconate (FERGON) 246 (28 FE) MG  tablet Take 1 tablet (246 mg total) by mouth 2 (two) times daily with a meal.  60 tablet  6  . FLUoxetine (PROZAC) 20 MG tablet Take 2 tablets (40 mg total) by mouth daily.  60 tablet  2  . hydrochlorothiazide (HYDRODIURIL) 25 MG tablet Take 1 tablet (25 mg total) by mouth daily.  30 tablet  6  . hydrochlorothiazide 12.5 MG capsule Take 1 capsule (12.5 mg total) by mouth daily.  30 capsule  2  . hydrocortisone (ANUSOL-HC) 25 MG suppository Place 1 suppository (25 mg total) rectally every 12 (twelve) hours.  12 suppository  3  . Multiple Vitamins-Minerals (MULTIVITAMIN) tablet Take 1 tablet by mouth daily.      Marland Kitchen omeprazole (PRILOSEC) 20 MG capsule TAKE 1 CAPSULE (20 MG TOTAL) BY MOUTH DAILY.  30 capsule  11  . zolpidem (AMBIEN) 10 MG tablet Take 1 tablet (10 mg total) by mouth at bedtime as needed.  90 tablet  0  . [DISCONTINUED] ALPRAZolam (XANAX) 1 MG tablet Take one tablet by mouth five times daily  450 tablet  0  . [DISCONTINUED] zolpidem (AMBIEN) 10 MG tablet Take 10 mg by mouth at bedtime as needed.       No facility-administered encounter medications on file as of 11/10/2012.    Past Psychiatric History/Hospitalization(s): Anxiety: Yes Bipolar Disorder: No Depression: Yes Mania: No Psychosis: No Schizophrenia: No Personality Disorder: No Hospitalization for psychiatric illness: Yes History of Electroconvulsive Shock Therapy: No Prior Suicide Attempts: No  Physical Exam: Constitutional:  BP 138/86  Pulse 68  Ht 5\' 5"  (1.651 m)  Wt 103.057 kg (227 lb 3.2 oz)  BMI 37.81 kg/m2  LMP 05/04/2010  General Appearance: alert, oriented, no acute distress, well nourished and casually dressed  Musculoskeletal: Strength & Muscle Tone: within normal limits Gait & Station: normal Patient leans: N/A  Psychiatric: Speech (describe rate, volume, coherence, spontaneity, and abnormalities if any): Clear and coherent with a regular rate and rhythm, and normal volume.  Thought Process  (describe rate, content, abstract reasoning, and computation): within normal limits  Associations: Intact  Thoughts: normal  Mental Status: Orientation: oriented to person, place, time/date and situation Mood & Affect: normal affect Attention Span & Concentration: intact  Medical Decision Making (Choose Three): Established Problem, Stable/Improving (1), Review of Psycho-Social Stressors (1) and Review of Medication Regimen & Side Effects (2)  Assessment: Axis I: social phobia  Axis II: deferred  Axis III: obesity, hypertension, GERD, plantar fasciitis  Axis IV: moderate to severe  Axis V: 70   Plan: We will continue her Xanax 1 mg 5 times daily, Ambien 10 mg at bedtime, and Prozac 40 mg daily as prescribed. She will return for followup in 3 months.  Albertina Leise, PA-C 11/11/2012

## 2012-11-24 ENCOUNTER — Ambulatory Visit (INDEPENDENT_AMBULATORY_CARE_PROVIDER_SITE_OTHER): Payer: Medicaid Other | Admitting: Internal Medicine

## 2012-11-24 DIAGNOSIS — D1720 Benign lipomatous neoplasm of skin and subcutaneous tissue of unspecified limb: Secondary | ICD-10-CM

## 2012-11-24 DIAGNOSIS — M940 Chondrocostal junction syndrome [Tietze]: Secondary | ICD-10-CM

## 2012-11-24 DIAGNOSIS — D1779 Benign lipomatous neoplasm of other sites: Secondary | ICD-10-CM

## 2012-11-24 DIAGNOSIS — D649 Anemia, unspecified: Secondary | ICD-10-CM

## 2012-11-24 LAB — CBC WITH DIFFERENTIAL/PLATELET
Basophils Absolute: 0 10*3/uL (ref 0.0–0.1)
Basophils Relative: 0 % (ref 0–1)
Eosinophils Absolute: 0.1 10*3/uL (ref 0.0–0.7)
Eosinophils Relative: 1 % (ref 0–5)
MCH: 29.3 pg (ref 26.0–34.0)
MCHC: 33.9 g/dL (ref 30.0–36.0)
MCV: 86.3 fL (ref 78.0–100.0)
Platelets: 270 10*3/uL (ref 150–400)
RDW: 14.3 % (ref 11.5–15.5)

## 2012-11-24 NOTE — Patient Instructions (Addendum)
1. Will check your A1C, CBC and TSH today 2. follow up with the clinic in 3 months 3. Please call the clinic if you left leg lipoma is growing.

## 2012-11-24 NOTE — Progress Notes (Signed)
Subjective:   Patient ID: Brenda Delacruz female   DOB: Jan 30, 1961 52 y.o.   MRN: [id number]  Chief complaints: left sided chest pain  HPI: Brenda Delacruz is a 52 y.o. woman with PMH of hypertension, obesity,GERD, anemia, gallstones,  plantar fasciitis, chronic back pain with radiculopathy,  anxiety and depression, who presents to clinic for followup visit.  Patient was treated for 3-week productive cough with Azithromycin and chlorpheniramine in May 2014. A chest X ray showed " no active lung disease".  She states that her cough symptoms are completely resolved except for left chest wall area occational aching and shoot pain for a few weeks.  Her chest pain is located on the left lateral chest wall, last from seconds to minutes, 5-7/10, no radiation, worse with deep breathing and bending/moving her upper body, and better with rest and holding breath. Denies fever, chills, headache, SOB, chest pressure, palpitation, nausea, vomiting, heartburn, abdominal pain, weakness, numbness or tingling.  Denies recent long distance travel.  Denies trauma or injury.  Denies history of shingles.  Of note, she follows up with home health behavioral health for treatment of anxiety and social phobia.  Behavior health manages her Xanax, Ambien and Prozac.   Past Medical History  Diagnosis Date  . Anemia   . Anxiety   . Depression   . Gallstones   . GERD (gastroesophageal reflux disease)   . Obesity    Current Outpatient Prescriptions  Medication Sig Dispense Refill  . ALPRAZolam (XANAX) 1 MG tablet Take one tablet by mouth five times daily  450 tablet  0  . cyclobenzaprine (FLEXERIL) 10 MG tablet TAKE ONE TAB TWICE PER DAY ONLY AS NEEDED FOR MUSCLE SPASM  40 tablet  3  . FLUoxetine (PROZAC) 20 MG tablet Take 2 tablets (40 mg total) by mouth daily.  60 tablet  2  . hydrochlorothiazide (HYDRODIURIL) 25 MG tablet Take 1 tablet (25 mg total) by mouth daily.  30 tablet  6  . hydrocortisone (ANUSOL-HC) 25 MG  suppository Place 1 suppository (25 mg total) rectally every 12 (twelve) hours.  12 suppository  3  . Multiple Vitamins-Minerals (MULTIVITAMIN) tablet Take 1 tablet by mouth daily.      Marland Kitchen omeprazole (PRILOSEC) 20 MG capsule TAKE 1 CAPSULE (20 MG TOTAL) BY MOUTH DAILY.  30 capsule  11  . zolpidem (AMBIEN) 10 MG tablet Take 1 tablet (10 mg total) by mouth at bedtime as needed.  90 tablet  0  . ferrous gluconate (FERGON) 246 (28 FE) MG tablet Take 1 tablet (246 mg total) by mouth 2 (two) times daily with a meal.  60 tablet  6   No current facility-administered medications for this visit.   Family History  Problem Relation Age of Onset  . Diabetes      family histoy  . Hypertension      family history   History   Social History  . Marital Status: Married    Spouse Name: N/A    Number of Children: N/A  . Years of Education: N/A   Social History Main Topics  . Smoking status: Former Smoker    Quit date: 07/17/1993  . Smokeless tobacco: Not on file  . Alcohol Use: No  . Drug Use: No  . Sexually Active: Not on file   Other Topics Concern  . Not on file   Social History Narrative   Financial assistance approved for 100% discount at [facility] and has [facility] card   D [facility] March  21,[birth year]86.      Regular exercise- no.   Review of Systems: Review of Systems:  Constitutional:  Denies fever, chills, diaphoresis, appetite change and fatigue.   HEENT:  Denies congestion, sore throat, rhinorrhea, sneezing, mouth sores, trouble swallowing, neck pain   Respiratory:  Denies SOB, DOE, cough, and wheezing. Positive for right chest wall pain  Cardiovascular:  Denies palpitations and leg swelling.   Gastrointestinal:  Denies nausea, vomiting, abdominal pain, diarrhea, constipation, blood in stool and abdominal distention.   Genitourinary:  Denies dysuria, urgency, frequency, hematuria, flank pain and difficulty urinating.   Musculoskeletal:  Denies myalgias, back pain, joint swelling, arthralgias and  gait problem.   Skin:  Denies pallor, rash and wound.   Neurological:  Denies dizziness, seizures, syncope, weakness, light-headedness, numbness and headaches.    .    Objective:  Physical Exam: Filed Vitals:   11/24/12 1506 11/24/12 1507  BP: 120/80   Pulse:  68  Temp: 97.1 F (36.2 C)   TempSrc: Oral   Height: 5\' 5"  (1.651 m)   Weight: 223 lb 14.4 oz (101.56 kg)   SpO2: 99%    General: alert, well-developed, and cooperative to examination.  Head: normocephalic and atraumatic.  Eyes: vision grossly intact, pupils equal, pupils round, pupils reactive to light, no injection and anicteric.  Mouth: pharynx pink and moist, no erythema, and no exudates.  Neck: supple, full ROM, no thyromegaly, no JVD, and no carotid bruits.  Lungs: normal respiratory effort, no accessory muscle use, normal breath sounds, no crackles, and no wheezes. Left chest wall reproducible point tenderness on 5th-6th rib area along the axillary line. No localized erythema, swelling, warmth to touch. No skin rash or vesicle noted.  Heart: normal rate, regular rhythm, no murmur, no gallop, and no rub.  Abdomen: soft, non-tender, normal bowel sounds, no distention, no guarding, no rebound tenderness, no hepatomegaly, and no splenomegaly.  Msk: no joint swelling, no joint warmth, and no redness over joints.  Pulses: 2+ DP/PT pulses bilaterally Extremities: No cyanosis, clubbing, edema. A small 1x 2 cm subcutaneous nodular growth noted on left mid calf. It can be felt under the skin as a soft, smooth, mobile, rubbery growth. The skin covering the growth is normal in appearance and can be moved back and forth over the nodule. Neurologic: alert & oriented X3, cranial nerves II-XII intact, strength normal in all extremities, sensation intact to light touch, and gait normal.  Skin: turgor normal and no rashes.  Psych: Oriented X3, memory intact for recent and remote, normally interactive, good eye contact, not anxious  appearing, and not depressed appearing.    Assessment & Plan:

## 2012-11-25 DIAGNOSIS — M940 Chondrocostal junction syndrome [Tietze]: Secondary | ICD-10-CM | POA: Insufficient documentation

## 2012-11-25 DIAGNOSIS — D1720 Benign lipomatous neoplasm of skin and subcutaneous tissue of unspecified limb: Secondary | ICD-10-CM | POA: Insufficient documentation

## 2012-11-25 NOTE — Assessment & Plan Note (Signed)
Her BMI is 37.6.  -Will check her hemoglobin A1c and TSH.

## 2012-11-25 NOTE — Progress Notes (Signed)
Case discussed with Dr. Li soon after the resident saw the patient. We reviewed the resident's history and exam and pertinent patient test results. I agree with the assessment, diagnosis, and plan of care documented in the resident's note. 

## 2012-11-25 NOTE — Assessment & Plan Note (Signed)
The clinical manifestation of left mid calf nodular growth is consistent with a small lipoma.  - Reassurance given to patient - Patient is instructed to call the clinic if this nodular growth becomes bigger or she has other associated symptoms.

## 2012-11-25 NOTE — Assessment & Plan Note (Addendum)
Patient presents with a few weeks duration of intermittent left lateral chest area aching and shoot pain worse with deep breathing and movement, and better with rest and hold ing breath. Patient was recently treated for 3 weeks of cough in May. Denies other associated symptoms.  Denies SOB or palpitation. Denies long distance travel, trauma or injury.   His assessment reveals left chest wall reproducible point tenderness on 5th-6th rib area along the axillary line. No localized erythema, swelling, warmth to touch. No skin rash or vesicle noted.   The clinical manifestation is consistent with musculoskeletal pain, ie costochondritis. The differential diagnosis include ACS/GERD/pericarditis/aortic dissection/PNA/PE.   For ACS, the characteristics of her chest pain worse with deep breathing and moving upper body and better with rest and holding breathing is not consistent with ACS.  The reproducible point tenderness on examination make ACS unlikely.  For aortic dissection>>> the clinical manifestations is not consistent with aortic dissection. Her chest pain lasted intermittently for a few weeks. She is hemodynamically stable. The aortic dissection is less likely. For pericarditis>>> no precipitating factors. Her chest pain is intermittent, only last for seconds to minutes, is not related to postures. No Rub noted on exam.  For pneumonia>>> no respiratory symptoms, lung examination unremarkable. PNA less likely.  For PE>>> no tachycardia or tachypnea, denies long-distance travel, no s/s of DVT, Geneva score 0 = low probability.   Plan:  - patient can use warm compress and OTC pain medication such as Ibuprofen 200 Q6prn for pain - call the clinic if her pain is not better or worsening.

## 2012-11-27 ENCOUNTER — Other Ambulatory Visit: Payer: Self-pay | Admitting: Internal Medicine

## 2012-11-27 ENCOUNTER — Other Ambulatory Visit: Payer: Self-pay | Admitting: Family Medicine

## 2012-11-27 DIAGNOSIS — Z1231 Encounter for screening mammogram for malignant neoplasm of breast: Secondary | ICD-10-CM

## 2012-12-04 ENCOUNTER — Ambulatory Visit (HOSPITAL_COMMUNITY)
Admission: RE | Admit: 2012-12-04 | Discharge: 2012-12-04 | Disposition: A | Discharge status: Home / Self Care | Payer: Medicaid Other | Source: Ambulatory Visit | Attending: Internal Medicine | Admitting: Internal Medicine

## 2012-12-04 DIAGNOSIS — Z1231 Encounter for screening mammogram for malignant neoplasm of breast: Secondary | ICD-10-CM | POA: Insufficient documentation

## 2012-12-15 ENCOUNTER — Ambulatory Visit (INDEPENDENT_AMBULATORY_CARE_PROVIDER_SITE_OTHER): Payer: Federal, State, Local not specified - Other | Admitting: Psychiatry

## 2012-12-15 DIAGNOSIS — F4010 Social phobia, unspecified: Secondary | ICD-10-CM

## 2012-12-16 NOTE — Progress Notes (Signed)
THERAPIST PROGRESS NOTE  Session Time: 3:30-4:30 pm  Participation Level: Active  Behavioral Response: CasualAlertDepressed  Type of Therapy: Individual Therapy  Treatment Goals addressed: Coping  Interventions: Supportive and Other: Updated Treatment Plan   Summary: Brenda Delacruz is a 52 y.o. female who presents with depressed mood and affect. This is Pts first session in several months due to Pt reducing therapy sessions due to improved mood and functioning. Pt returned to counseling due to the death of her mother this past 11/07/22. Pt described an increase in depression symptoms following this loss and wanted to return to more regular acounseling for support. Pt also stated she is stressed about her appeal coming up for her long term disability. Updated treatment plan see below.      INDIVIDUAL TREATMENT PLAN       Date of Admission:   Date of Treatment Plan: 12/16/12 Service: [x]  Group  [x]  Individual [x]  Comprehensive [x]  Revised due to: []  Change in Diagnosis     []  Change in Service     [x]  Expiration of Previous Treatment Plan  Diagnosis(es): Social Anxiety Disorder and Depression, Major, Recurrent, Severe   Recommended Treatment:  [x]  Chemical Dependency (CD-IOP) group therapy 3x weekly and 1:1 individual therapy as required  []  Mental Health (MH-IOP) group therapy 5x weekly and 1:1 individual therapy as required  [x]  Individual Therapy  []  Family Therapy  [x]  Couples Therapy  [] Other:           Possible Negative Outcomes of Treatment: Symptoms of mental illness may increase and changes in relationship can occur during the course of treatment.  Client's Strengths (What are the strengths and resources that will help clients move towards their goals):  Compassionate, helpful to others, motivated, good wife, good mother   Personal Recovery Goal(s)/Client's Goals for Treatment (use client's words):      Objective Behavioral Criteria for Discharge: Client will  maintain stability in the community as demonstrated by the following:   No suicidal behaviors for 3 months.   No hospitalizations or emergency room visits for psychological reasons for 3 months.   No SIB behaviors requiring medical attention for 3 months.   Emotional regulation by reporting distress at an average of 5/10 or below for 3 months.   Demonstration of healthy community supports by initiating peer contact at least twice weekly separate from the treatment environment.   Agreed-upon transition plan to a less restrictive setting.  INFORMED CONSENT TO TREATMENT. I acknowledge that I have been informed of and am able to understand my diagnosis, the nature and purpose of the proposed treatment, the risks and benefits of the proposed treatment, the possible negative outcomes of and possible alternatives to the proposed treatment, the probability that the proposed treatment will be successful, and the prognosis if I choose not to receive the treatment. Further, I have been informed of the extent and limits of confidentiality of treatment information. I understand the risks, benefits, possible negative outcomes of and alternatives to this proposed treatment, and my signature below verifies that I have actively participated in the development of my treatment plan and that I am willingly and voluntarily agreeing to the treatment outlined in this plan. Assessed Needs (Problem) Client's Goals (what client will do)   Interventions (what staff will do)   1. Depression  o Have improved mood and return to a healthier level of functioning.   o Identify the causes for depressed mood and learn ways to cope with depression.  o Use  a crisis plan if suicidal thoughts occur.    o Explore how depression is experienced in day-to-day living; encourage sharing of feelings of depression to gain insight into causes and create a coping plan to manage depression symptoms. o Provide education about depression to help  patient identify causes, symptoms and triggers. o Teach and encourage the use of coping skills for management of depression symptoms.  o Make a crisis plan and assess ongoing level of safety.   2. Anxiety    o Improve ability to manage anxiety symptoms and better handle stress.  o Identify causes for anxiety and explore ways to lower it  o Resolve the core conflict that is the cause of the anxiety.  o Manage thoughts and worrisome thinking that contribute to feelings of anxiety.    o Complete anxiety assessment (Burns Anxiety Inventory) to explore severity of symptoms. o Provide education about anxiety to help patient identify causes, symptoms and triggers. o Facilitate problem solution skills to help patient identify and implement options to resolve stressors.  o Teach coping skills to manage anxiety symptoms such as; biofeedback, guided imagery, meditation and aromatherapy. o Use CBT to identify and change anxiety provoking thought patterns.   3. Social Anxiety  o  To have less fear and anxiety in social situations.  o Be able to have more interactions with others and have less fear o Go out into public situations with decreased isolation   o Teach anxiety management skills that can be used to increase calm in social situations such as heartmath, relaxation and breathing techniques.  o Assist in developing positive self-talk that will help overcome fear in social situations.  o Teach DBT distress tolerance skills to increase ability to manage anxiety in social situations o Encourage and reinforce social behaviors.   I have [] received [] declined a copy of this treatment plan.  Client: Date: Guardian/Parent: Date:   Therapist: Date: Licensed Provider (if applicable): Date:    Six month review:   I have reviewed this treatment plan and consider it still valid. Client: Date: Guardian/Parent: Date:      Suicidal/Homicidal: Nowithout intent/plan  Therapist Response: Assessed  overall level of functioning and reason for returning to therapy. Provided supportive therapy and updated treatment plan to reflect current goals.   Plan: Return again at first available appointment and resume regular counseling sessions. Sign Treatment Plan at next session.   Diagnosis: Axis I: Social Anxiety and Depression, Major, Recurrent, Severe    Axis II: No diagnosis    Joey Hudock E, LCSW 12/16/2012

## 2013-01-16 ENCOUNTER — Other Ambulatory Visit: Payer: Self-pay | Admitting: Internal Medicine

## 2013-01-20 ENCOUNTER — Telehealth (HOSPITAL_COMMUNITY): Payer: Self-pay

## 2013-01-29 ENCOUNTER — Ambulatory Visit (HOSPITAL_COMMUNITY): Payer: Self-pay | Admitting: Psychiatry

## 2013-02-02 ENCOUNTER — Ambulatory Visit (HOSPITAL_COMMUNITY): Payer: Self-pay | Admitting: Psychiatry

## 2013-02-09 ENCOUNTER — Ambulatory Visit (HOSPITAL_COMMUNITY): Payer: Self-pay | Admitting: Psychiatry

## 2013-02-09 ENCOUNTER — Ambulatory Visit (INDEPENDENT_AMBULATORY_CARE_PROVIDER_SITE_OTHER): Payer: Federal, State, Local not specified - Other | Admitting: Psychiatry

## 2013-02-09 DIAGNOSIS — F4010 Social phobia, unspecified: Secondary | ICD-10-CM

## 2013-02-09 NOTE — Progress Notes (Signed)
   THERAPIST PROGRESS NOTE  Session Time: 2:30-3:30 pm  Participation Level: Active  Behavioral Response: Casual, Neat and Well GroomedAlertDepressed  Type of Therapy: Individual Therapy  Treatment Goals addressed: Coping  Interventions: Supportive and Other: Grief and loss  Summary: Brenda Delacruz is a 52 y.o. female who presents with mild depressed mood associated with the death of her mother this past Nov 27, 2022. Pt reports still "missing her" and talked about how difficult it is for her to go through her mothers clothing that are still in Pts house. Pt said it gives her comfort to have some of her mothers belongings near her. Pt appears to be grieving her mother and processing the grief in a healthy way. Pt also said she is proceeding with her long term disability appeal and is awaiting her court date. Pt said she is managing the anxiety better this time with the appeal process and not letting it overtake her life.    Suicidal/Homicidal: Nowithout intent/plan  Therapist Response: Assessed overall level of depression per Pt self report and processed loss associated with the death of Pts mother and Pts grieving process, normalized feelings of loss.   Plan: Return again in 4 weeks.  Diagnosis: Axis I: Social Phobia    Axis II: No diagnosis    Carman Ching, LCSW 02/09/2013

## 2013-02-10 ENCOUNTER — Ambulatory Visit (INDEPENDENT_AMBULATORY_CARE_PROVIDER_SITE_OTHER): Payer: Federal, State, Local not specified - Other | Admitting: Physician Assistant

## 2013-02-10 ENCOUNTER — Encounter (HOSPITAL_COMMUNITY): Payer: Self-pay | Admitting: Physician Assistant

## 2013-02-10 VITALS — BP 111/93 | HR 98 | Ht 65.0 in | Wt 231.2 lb

## 2013-02-10 DIAGNOSIS — F4010 Social phobia, unspecified: Secondary | ICD-10-CM

## 2013-02-10 DIAGNOSIS — F331 Major depressive disorder, recurrent, moderate: Secondary | ICD-10-CM

## 2013-02-10 MED ORDER — ALPRAZOLAM 1 MG PO TABS: ORAL_TABLET | ORAL | Status: DC

## 2013-02-10 MED ORDER — FLUOXETINE HCL 20 MG PO TABS: 40.0000 mg | ORAL_TABLET | Freq: Every day | ORAL | Status: DC

## 2013-02-18 ENCOUNTER — Encounter (HOSPITAL_COMMUNITY): Payer: Self-pay | Admitting: Physician Assistant

## 2013-02-18 DIAGNOSIS — F331 Major depressive disorder, recurrent, moderate: Secondary | ICD-10-CM | POA: Insufficient documentation

## 2013-02-18 NOTE — Progress Notes (Signed)
   [facility] Follow-up Outpatient Visit  Brenda Delacruz 09-Oct-1960  Date: 02/10/2013   Subjective: Brenda Delacruz presents today to followup on her treatment for social phobia and depression. When asked what she was doing these days she stated "same old same old." She is seeing her therapist wants monthly now. He reports that her anxiety is "okay," and she avoids going out, and if she does go out she goes out at night. She is still waiting for a new hearing for her disability. She reports that she falls asleep okay, but she wakes a few times during the night. She takes Ambien occasionally, and she sleeps better with it rather than without it. She reports that her appetite is "too good. She states her blood pressure is up and down. She denies any suicidal or homicidal ideation. She denies any auditory or visual hallucinations.  Filed Vitals:   02/10/13 1520  BP: 111/93  Pulse: 98    Mental Status Examination  Appearance: Casual Alert: Yes Attention: good  Cooperative: Yes Eye Contact: Minimal Speech: Clear and coherent Psychomotor Activity: Normal Memory/Concentration: Intact Oriented: person, place, time/date and situation Mood: Anxious and Depressed Affect: Congruent Thought Processes and Associations: Linear Fund of Knowledge: Good Thought Content: Normal Insight: Fair Judgement: Fair  Diagnosis: Social phobia; major depressive disorder, recurrent, moderate  Treatment Plan: We will continue her Xanax 1 mg 5 times daily, Ambien 10 mg at bedtime, and Prozac 40 mg daily as prescribed. She will return for followup in 3 months.   Geddy Boydstun, PA-C

## 2013-03-13 ENCOUNTER — Other Ambulatory Visit: Payer: Self-pay | Admitting: *Deleted

## 2013-03-13 MED ORDER — CYCLOBENZAPRINE HCL 10 MG PO TABS: ORAL_TABLET | ORAL | Status: AC

## 2013-03-25 ENCOUNTER — Ambulatory Visit (HOSPITAL_COMMUNITY): Payer: Self-pay | Admitting: Psychiatry

## 2013-03-27 ENCOUNTER — Ambulatory Visit (HOSPITAL_COMMUNITY): Payer: Self-pay | Admitting: Psychiatry

## 2013-03-31 ENCOUNTER — Other Ambulatory Visit (HOSPITAL_COMMUNITY): Payer: Self-pay | Admitting: Physician Assistant

## 2013-04-01 ENCOUNTER — Other Ambulatory Visit (HOSPITAL_COMMUNITY): Payer: Self-pay | Admitting: *Deleted

## 2013-04-01 DIAGNOSIS — F331 Major depressive disorder, recurrent, moderate: Secondary | ICD-10-CM

## 2013-04-01 MED ORDER — ZOLPIDEM TARTRATE 10 MG PO TABS: 10.0000 mg | ORAL_TABLET | Freq: Every evening | ORAL | Status: AC | PRN

## 2013-04-22 ENCOUNTER — Ambulatory Visit (INDEPENDENT_AMBULATORY_CARE_PROVIDER_SITE_OTHER): Payer: Federal, State, Local not specified - Other | Admitting: Psychiatry

## 2013-04-22 DIAGNOSIS — F4010 Social phobia, unspecified: Secondary | ICD-10-CM

## 2013-04-22 DIAGNOSIS — F341 Dysthymic disorder: Secondary | ICD-10-CM

## 2013-04-23 NOTE — Progress Notes (Deleted)
Patient ID: Brenda Delacruz, female   DOB: 10-12-1960, 51 y.o.   MRN: [id number]

## 2013-04-23 NOTE — Progress Notes (Signed)
   THERAPIST PROGRESS NOTE  Session Time: 3:30-4:20 pm  Participation Level: Active  Behavioral Response: CasualAlertAnxious and Depressed  Type of Therapy: Individual Therapy  Treatment Goals addressed: Coping  Interventions: Supportive  Summary: Brenda Delacruz is a 52 y.o. female who presents with depressed mood and affect associated with still grieving the death of her mother from six months ago. Pt described "missing her all the time" and talked about her difficulty with going through her mothers belongings because it is too painful. Pt discussed how difficult the holidays will be without her and what a change it will be for the family.  Pt said she is managing well overall and using her coping skills. Pt still has high anxiety associated with social interactions that is managed only when Pt follows a strict social rourtint of avoiding situations with a lot of people. Pt said she is still awaiting news on her disability appeals hearing.    Suicidal/Homicidal: Nowithout intent/plan  Therapist Response: Assessed overall level of functioning, and provided grief counseling over loss of her mother.   Plan: Return again in 4 weeks.  Diagnosis: Axis I: Depression Major Recurent Severe, and social phobia    Axis II: No diagnosis    Carman Ching, LCSW 04/23/2013

## 2013-05-06 ENCOUNTER — Ambulatory Visit (HOSPITAL_COMMUNITY): Payer: Self-pay | Admitting: Physician Assistant

## 2013-05-13 ENCOUNTER — Ambulatory Visit (INDEPENDENT_AMBULATORY_CARE_PROVIDER_SITE_OTHER): Payer: Federal, State, Local not specified - Other | Admitting: Psychiatry

## 2013-05-13 DIAGNOSIS — F4322 Adjustment disorder with anxiety: Secondary | ICD-10-CM

## 2013-05-13 DIAGNOSIS — F4010 Social phobia, unspecified: Secondary | ICD-10-CM

## 2013-05-13 DIAGNOSIS — F4323 Adjustment disorder with mixed anxiety and depressed mood: Secondary | ICD-10-CM

## 2013-05-13 MED ORDER — FLUOXETINE HCL 20 MG PO TABS: ORAL_TABLET | ORAL | Status: AC

## 2013-05-13 MED ORDER — ALPRAZOLAM 1 MG PO TABS: ORAL_TABLET | ORAL | Status: AC

## 2013-05-13 NOTE — Progress Notes (Signed)
[facility] MD Progress Note  05/13/2013 4:19 PM Brenda Delacruz  MRN:  [id number] Subjective:  Nervous Today the patient is seen alone. Her husband is available but he did not come in for today's visit. The patient describes chronic anxiety that she's had for the last 2-3 years. She's been treated with high-dose Xanax. A close dilation the patient has equivocal evidence of generalized anxiety disorder. She is unable describe major depression and she's not able describe really social phobia. The patient takes Prozac 20 mg twice a day. The patient denies being depressed on a daily basis she is sleeping and eating fine. She likes reading and watching TV she is hesitant about going out in public. She feels very comfortable but does not describe feeling criticized in public. Generally she feels very anxious. She is a very poor historian. The patient denies use of alcohol or drugs she denies any psychotic symptoms. She does not define panic disorder or OCD at all. The patient is married for 29 years in a stable marriage has one son is 92 his doing well. The patient used to be employed up until 2002. She is now applying for disability. Diagnosis:   DSM5: Schizophrenia Disorders:   Obsessive-Compulsive Disorders:   Trauma-Stressor Disorders:   Substance/Addictive Disorders:   Depressive Disorders:    Axis I: Adjustment Disorder with Anxiety  ADL's:  Intact  Sleep: Good  Appetite:  Good  Suicidal Ideation:  no Homicidal Ideation:  no AEB (as evidenced by):  Psychiatric Specialty Exam: ROS  Last menstrual period 05/04/2010.There is no weight on file to calculate BMI.  General Appearance:   Eye Contact::  Good  Speech:  Clear and Coherent  Volume:  Normal  Mood:  Euthymic  Affect:  Blunt  Thought Process:  Coherent  Orientation:  Full (Time, Place, and Person)  Thought Content:  WDL  Suicidal Thoughts:  No  Homicidal Thoughts:  No  Memory:  nl  Judgement:  Good  Insight:  Good  Psychomotor  Activity:  Normal  Concentration:  Good  Recall:  Good  Akathisia:  No  Handed:  Right  AIMS (if indicated):     Assets:  Desire for Improvement  Sleep:      Current Medications: Current Outpatient Prescriptions  Medication Sig Dispense Refill  . ALPRAZolam (XANAX) 1 MG tablet 1 qid  120 tablet  2  . cyclobenzaprine (FLEXERIL) 10 MG tablet TAKE ONE TAB TWICE PER DAY ONLY AS NEEDED FOR MUSCLE SPASM  40 tablet  3  . ferrous gluconate (FERGON) 246 (28 FE) MG tablet Take 1 tablet (246 mg total) by mouth 2 (two) times daily with a meal.  60 tablet  6  . FLUoxetine (PROZAC) 20 MG tablet TAKE 2 TABLETS BY MOUTH DAILY  60 tablet  2  . hydrochlorothiazide (HYDRODIURIL) 25 MG tablet TAKE 1 TABLET (25 MG TOTAL) BY MOUTH DAILY.  30 tablet  6  . hydrocortisone (ANUSOL-HC) 25 MG suppository Place 1 suppository (25 mg total) rectally every 12 (twelve) hours.  12 suppository  3  . Multiple Vitamins-Minerals (MULTIVITAMIN) tablet Take 1 tablet by mouth daily.      Marland Kitchen omeprazole (PRILOSEC) 20 MG capsule TAKE 1 CAPSULE (20 MG TOTAL) BY MOUTH DAILY.  30 capsule  11  . zolpidem (AMBIEN) 10 MG tablet Take 1 tablet (10 mg total) by mouth at bedtime as needed.  90 tablet  0   No current facility-administered medications for this visit.    Lab Results: No results found  for this or any previous visit (from the past 48 hour(s)).  Physical Findings: AIMS:  , ,  ,  ,    CIWA:    COWS:     Treatment Plan Summary: At this time we'll begin reducing this patient Xanax. I am unclear why she's been taking this much Xanax for 2 years. We will slowly reduce it and probably discontinue it. She's taking 5 a day and we will start by decreasing it to 4 day. 2 continue taking Ambien 10 mg for sleep and Prozac 20 twice a day. This patient she'll be reevaluated in 2 months. At that time we will reevaluate anxiety disorders can consider discontinuing her Xanax at a very slow manner.  Plan:  Medical Decision Making Problem  Points:  New problem, with additional work-up planned (4) Data Points:  Review of medication regiment & side effects (2)  I certify that inpatient services furnished can reasonably be expected to improve the patient's condition.   Brenda Delacruz 05/13/2013, 4:19 PM

## 2013-05-22 ENCOUNTER — Ambulatory Visit (HOSPITAL_COMMUNITY): Payer: Self-pay | Admitting: Psychiatry

## 2013-06-05 ENCOUNTER — Ambulatory Visit (HOSPITAL_COMMUNITY): Payer: Self-pay | Admitting: Psychiatry

## 2013-06-25 ENCOUNTER — Telehealth: Payer: Self-pay | Admitting: *Deleted

## 2013-06-25 ENCOUNTER — Ambulatory Visit (INDEPENDENT_AMBULATORY_CARE_PROVIDER_SITE_OTHER): Payer: Federal, State, Local not specified - Other | Admitting: Psychiatry

## 2013-06-25 DIAGNOSIS — F341 Dysthymic disorder: Secondary | ICD-10-CM

## 2013-06-25 DIAGNOSIS — F4010 Social phobia, unspecified: Secondary | ICD-10-CM

## 2013-06-25 NOTE — Progress Notes (Signed)
   THERAPIST PROGRESS NOTE  Session Time: 2:30-3:25 pm  Participation Level: Active  Behavioral Response: CasualAlertDepressed  Type of Therapy: Individual Therapy  Treatment Goals addressed: Diagnosis: goals of Depression  Interventions: CBT and Other: Grief Counseling  Summary: ILYA ESS is a 53 y.o. female who presents with moderate depression with tearfulness. Pt said she has been feeling sick for the past two weeks with painful coughing and chest congestion that has gone untreated. Pt said she believes being sick is starting to wear on her emotionally. Pts main concern this session was grieving the death of her mom who died this past 2022-10-21. 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 Pt processed feelings of guilt associated with still grieving and processed how she was taught "not to cry" from her father and how this causes her to struggle crying today. Pt said she thinks resuming counseling every two weeks to help with the grief process would be helpful. Pt agreed to see Probation officer in the [place] office given writers transition next week to this new office.    Suicidal/Homicidal: Nowithout intent/plan  Therapist Response: Assessed overall level of depression symptoms per Pt self report. Worked on goal of depression by processing grief associated with her mothers death and explored patterns of past grieve and healthier ways of grieving today.   Plan: Return again in 2 weeks in the [place] office with Probation officer.   Diagnosis: Axis I: Depression anxiety and social phobia    Axis II: No diagnosis    Tresa Res, LCSW 06/25/2013

## 2013-06-25 NOTE — Telephone Encounter (Signed)
Pt presents at front desk asking to be seen, she states for 1 week she has had chest discomfort, diarrhea 2-3 days, cough x 1 week, weakness, was at [facility] today and almost "collapsed". She is ask to go to ED or urg care and she refuses, it is explained that she really should go to ED and continues to refuse. Her spouse states he has had cancer and doesn't need to be around sick people. She refuses again, appt for fri 1/23 at 1545 dr Naaman Plummer

## 2013-06-25 NOTE — Telephone Encounter (Signed)
Agree with ED referral but since pt refuses, [facility] appt OK.

## 2013-06-26 ENCOUNTER — Ambulatory Visit (INDEPENDENT_AMBULATORY_CARE_PROVIDER_SITE_OTHER): Payer: Medicaid Other | Admitting: Internal Medicine

## 2013-06-26 VITALS — BP 90/60 | HR 125 | Temp 98.8°F | Wt 231.1 lb

## 2013-06-26 DIAGNOSIS — R0989 Other specified symptoms and signs involving the circulatory and respiratory systems: Secondary | ICD-10-CM

## 2013-06-26 DIAGNOSIS — R0789 Other chest pain: Secondary | ICD-10-CM

## 2013-06-26 DIAGNOSIS — R059 Cough, unspecified: Secondary | ICD-10-CM

## 2013-06-26 DIAGNOSIS — J988 Other specified respiratory disorders: Principal | ICD-10-CM

## 2013-06-26 DIAGNOSIS — B9789 Other viral agents as the cause of diseases classified elsewhere: Secondary | ICD-10-CM

## 2013-06-26 DIAGNOSIS — I951 Orthostatic hypotension: Secondary | ICD-10-CM

## 2013-06-26 DIAGNOSIS — J029 Acute pharyngitis, unspecified: Secondary | ICD-10-CM

## 2013-06-26 DIAGNOSIS — R0609 Other forms of dyspnea: Secondary | ICD-10-CM

## 2013-06-26 DIAGNOSIS — R5383 Other fatigue: Secondary | ICD-10-CM

## 2013-06-26 DIAGNOSIS — R197 Diarrhea, unspecified: Secondary | ICD-10-CM

## 2013-06-26 DIAGNOSIS — R05 Cough: Secondary | ICD-10-CM

## 2013-06-26 DIAGNOSIS — R5381 Other malaise: Secondary | ICD-10-CM

## 2013-06-26 MED ORDER — BENZONATATE 100 MG PO CAPS: 100.0000 mg | ORAL_CAPSULE | Freq: Four times a day (QID) | ORAL | Status: AC | PRN

## 2013-06-26 NOTE — Patient Instructions (Addendum)
-  Drink plenty of fluids and rest  -Do not take your HCTZ until you feel back to normal and drinking normally -Take tessalon pearls as needed for cough -If your symptoms worsen come back or go to ED

## 2013-06-26 NOTE — Progress Notes (Signed)
Patient ID: Brenda Delacruz, female   DOB: 1960/07/26, 53 y.o.   MRN: [id number]   Subjective:   Patient ID: Brenda Delacruz female   DOB: 06/19/1960 53 y.o.   MRN: [id number]  HPI: [name] is a 53 y.o. with past medical history of hypertension, anxiety, depression, anemia, and GERD who presents for acute clinic visit. Pt reports that for the past week she has had persistent dry "hacking" cough that occurs with deep breathing and speaking with associated dyspnea and chest tightness located in the center of her chest. She denies palpitations, diaphoresis, or jaw pain. She has no past history of COPD or asthma and does not use inhalers. She denies leg swelling, pain, or recent travel.  She also reports diarrhea (dark green) without blood for the past 5 days that is improving after she took peptobismo two days ago with some relief. She has had decreased appetite and PO intake also for the past 5 days. She continues to take her HCTZ for blood pressure daily. She denies abdominal pain, vomiting, or weight loss. She reports fever (100.2), chills, fatigue,  weakness, nausea, and lightheadedness on standing..She states her left ear hurts when she swallows. She denies rhinorrhea, sneezing, sore throat, or recent sick contacts. She did not receive the flu shot this year. She reports similar symptoms last year when she had pneumonia and was given antibiotics with improvement.     Past Medical History  Diagnosis Date  . Anemia   . Anxiety   . Depression   . Gallstones   . GERD (gastroesophageal reflux disease)   . Obesity    Current Outpatient Prescriptions  Medication Sig Dispense Refill  . ALPRAZolam (XANAX) 1 MG tablet 1 qid  120 tablet  2  . benzonatate (TESSALON PERLES) 100 MG capsule Take 1 capsule (100 mg total) by mouth every 6 (six) hours as needed for cough.  30 capsule  0  . cyclobenzaprine (FLEXERIL) 10 MG tablet TAKE ONE TAB TWICE PER DAY ONLY AS NEEDED FOR MUSCLE SPASM  40 tablet  3    . ferrous gluconate (FERGON) 246 (28 FE) MG tablet Take 1 tablet (246 mg total) by mouth 2 (two) times daily with a meal.  60 tablet  6  . FLUoxetine (PROZAC) 20 MG tablet TAKE 2 TABLETS BY MOUTH DAILY  60 tablet  2  . hydrochlorothiazide (HYDRODIURIL) 25 MG tablet TAKE 1 TABLET (25 MG TOTAL) BY MOUTH DAILY.  30 tablet  6  . hydrocortisone (ANUSOL-HC) 25 MG suppository Place 1 suppository (25 mg total) rectally every 12 (twelve) hours.  12 suppository  3  . Multiple Vitamins-Minerals (MULTIVITAMIN) tablet Take 1 tablet by mouth daily.      Marland Kitchen omeprazole (PRILOSEC) 20 MG capsule TAKE 1 CAPSULE (20 MG TOTAL) BY MOUTH DAILY.  30 capsule  11  . zolpidem (AMBIEN) 10 MG tablet Take 1 tablet (10 mg total) by mouth at bedtime as needed.  90 tablet  0   No current facility-administered medications for this visit.   Family History  Problem Relation Age of Onset  . Diabetes      family histoy  . Hypertension      family history   History   Social History  . Marital Status: Married    Spouse Name: N/A    Number of Children: N/A  . Years of Education: N/A   Social History Main Topics  . Smoking status: Former Smoker    Quit date: 07/17/1993  .  Smokeless tobacco: Not on file  . Alcohol Use: No  . Drug Use: No  . Sexual Activity: Not on file   Other Topics Concern  . Not on file   Social History Narrative   Financial assistance approved for 100% discount at [facility] and has [facility] card   [facility] March 23,2011.      Regular exercise- no.   Review of Systems:Review of Systems  Constitutional: Positive for fever (low-grade), chills and malaise/fatigue. Negative for weight loss and diaphoresis.  HENT: Positive for ear pain (left) and sore throat. Negative for congestion.   Eyes: Negative for blurred vision.  Respiratory: Positive for cough, shortness of breath and wheezing. Negative for hemoptysis and sputum production.   Cardiovascular: Positive for chest pain. Negative for palpitations and  leg swelling.  Gastrointestinal: Positive for nausea and diarrhea. Negative for vomiting, abdominal pain, constipation and blood in stool.  Genitourinary: Negative for dysuria, urgency, frequency and hematuria.  Skin: Negative for rash.  Neurological: Positive for dizziness and weakness. Negative for sensory change, speech change, loss of consciousness and headaches.    Objective:  Physical Exam: Filed Vitals:   06/26/13 1559 06/26/13 1630 06/26/13 1635  BP:  110/80 90/60  Pulse: 113 103 125  Temp: 98.8 F (37.1 C)    TempSrc: Oral    Weight: 231 lb 1.6 oz (104.826 kg)    SpO2: 96%  96%    Physical Exam  Constitutional: She is oriented to person, place, and time. She appears well-developed and well-nourished. She appears distressed (when coughing).  HENT:  Head: Normocephalic and atraumatic.  Right Ear: External ear normal.  Left Ear: External ear normal.  Nose: Nose normal.  Mouth/Throat: Oropharynx is clear and moist. No oropharyngeal exudate.  Eyes: Conjunctivae and EOM are normal. Pupils are equal, round, and reactive to light. Right eye exhibits no discharge. Left eye exhibits no discharge. No scleral icterus.  Neck: Normal range of motion. Neck supple.  Cardiovascular: Regular rhythm and normal heart sounds.   Tachycardic  Pulmonary/Chest: Effort normal and breath sounds normal. No respiratory distress. She has no wheezes. She has no rales. She exhibits no tenderness.  Abdominal: Soft. Bowel sounds are normal. She exhibits no distension. There is no tenderness. There is no rebound and no guarding.  Musculoskeletal: Normal range of motion. She exhibits no edema and no tenderness.  Neurological: She is alert and oriented to person, place, and time. No cranial nerve deficit.  Normal 5/5  extremity strength  Skin: Skin is warm and dry. No rash noted. She is not diaphoretic. No erythema. No pallor.  Psychiatric:  anxious mood    Assessment & Plan:   Viral Respiratory/GI  Illness  Assesment: Pt presents with 1 week history of flu-like symptoms with dry cough, chest tightness, dyspnea, sore throat, decreased PO intake, weakness, and diarrhea (improving) with normal lung and abdominal exam with +1 SIRS criteria (tachycardia) with etiology most likely due to viral ilness, posslbly influenza virus.    Plan: -Ambulate with measurement of SpO2 ---> 94% with ambulation on room air -Symptomatic support with rest and increasing fluid intake -Hold HCTZ in setting orthostatic hypotension and GI losses (diarrhea)  -Tessalon pearls for cough -No tamiflu indicated due to symptom onset  >48 hrs -Pt instructed to go to ED or return if symptoms do not improve   Orthostatic Hypotension   Assessment: Pt with lightheadedness on standing with orthostatic vital signs revealing orthostatic hypotension most likely due to 5-day history of diarrhea (improving) in  setting of decreased PO intake and diuretic use.  Plan -Obtain orthostatic vital signs --> 110/80 HR 103 lying to 90/60 HR 125 standing -Pt instructed to stop taking HCTZ 25 mg daily until has good PO intake and then resume -Pt instructed to increase fluid intake -Pt instructed to go to ED or return if symptoms do not improve

## 2013-07-02 ENCOUNTER — Encounter: Payer: Self-pay | Admitting: Internal Medicine

## 2013-07-02 ENCOUNTER — Telehealth: Payer: Self-pay | Admitting: Internal Medicine

## 2013-07-02 ENCOUNTER — Ambulatory Visit (INDEPENDENT_AMBULATORY_CARE_PROVIDER_SITE_OTHER): Payer: Medicaid Other | Admitting: Internal Medicine

## 2013-07-02 ENCOUNTER — Other Ambulatory Visit: Payer: Self-pay | Admitting: Internal Medicine

## 2013-07-02 VITALS — BP 111/55 | HR 104 | Temp 97.0°F | Wt 234.1 lb

## 2013-07-02 DIAGNOSIS — A084 Viral intestinal infection, unspecified: Secondary | ICD-10-CM

## 2013-07-02 DIAGNOSIS — A088 Other specified intestinal infections: Secondary | ICD-10-CM

## 2013-07-02 DIAGNOSIS — E876 Hypokalemia: Secondary | ICD-10-CM

## 2013-07-02 DIAGNOSIS — I959 Hypotension, unspecified: Secondary | ICD-10-CM

## 2013-07-02 DIAGNOSIS — R0602 Shortness of breath: Secondary | ICD-10-CM

## 2013-07-02 LAB — CBC
HCT: 36.2 % (ref 36.0–46.0)
Hemoglobin: 12.4 g/dL (ref 12.0–15.0)
MCH: 29.7 pg (ref 26.0–34.0)
MCHC: 34.3 g/dL (ref 30.0–36.0)
MCV: 86.6 fL (ref 78.0–100.0)
PLATELETS: 320 10*3/uL (ref 150–400)
RBC: 4.18 MIL/uL (ref 3.87–5.11)
RDW: 14.9 % (ref 11.5–15.5)
WBC: 10.3 10*3/uL (ref 4.0–10.5)

## 2013-07-02 LAB — BASIC METABOLIC PANEL
BUN: 7 mg/dL (ref 6–23)
CALCIUM: 7.8 mg/dL — AB (ref 8.4–10.5)
CO2: 26 mEq/L (ref 19–32)
Chloride: 99 mEq/L (ref 96–112)
Creat: 0.62 mg/dL (ref 0.50–1.10)
Glucose, Bld: 120 mg/dL — ABNORMAL HIGH (ref 70–99)
Potassium: 3.3 mEq/L — ABNORMAL LOW (ref 3.5–5.3)
SODIUM: 139 meq/L (ref 135–145)

## 2013-07-02 MED ORDER — DEXTROMETHORPHAN-GUAIFENESIN 10-100 MG/5ML PO LIQD: 10.0000 mL | ORAL | Status: AC | PRN

## 2013-07-02 MED ORDER — ALBUTEROL SULFATE HFA 108 (90 BASE) MCG/ACT IN AERS: 2.0000 | INHALATION_SPRAY | Freq: Four times a day (QID) | RESPIRATORY_TRACT | Status: AC | PRN

## 2013-07-02 MED ORDER — ALBUTEROL SULFATE (2.5 MG/3ML) 0.083% IN NEBU
2.5000 mg | INHALATION_SOLUTION | Freq: Once | RESPIRATORY_TRACT | Status: AC
  Administered 2013-07-02: 2.5 mg via RESPIRATORY_TRACT

## 2013-07-02 MED ORDER — GUAIFENESIN ER 600 MG PO TB12: 600.0000 mg | ORAL_TABLET | Freq: Two times a day (BID) | ORAL | Status: AC

## 2013-07-02 MED ORDER — IPRATROPIUM BROMIDE 0.02 % IN SOLN
0.5000 mg | Freq: Once | RESPIRATORY_TRACT | Status: AC
  Administered 2013-07-02: 0.5 mg via RESPIRATORY_TRACT

## 2013-07-02 MED ORDER — POTASSIUM CHLORIDE ER 20 MEQ PO TBCR: 40.0000 meq | EXTENDED_RELEASE_TABLET | Freq: Once | ORAL | Status: AC

## 2013-07-02 NOTE — Progress Notes (Signed)
1505PM- IV d/c intact - site unremarkable.

## 2013-07-02 NOTE — Patient Instructions (Signed)
It was nice to see you today and sorry you are not feeling well.   For your cold:  1. Get plenty of rest 2. Stop taking your blood pressure pill 3. i wrote for some cough syrup you can take every 4 hours 4. mucinex to help release some of the mucus 5. If you blood tests are concerning for an infection will call in a Antibiotic if you don't hear from [place] your labs were normal and keep doing as you are doing.  6. Continue to drink lots of fluids  Have a happy new year

## 2013-07-02 NOTE — Telephone Encounter (Signed)
I was called from chemical lab at 18:04 PM about her BMP result. Patient was seen in clinic today and BMP was ordered, which showed K=3.3. I called patient who reported that there is no change in her health condition since seen in clinic today. I sent a prescription of 40 mEq of KCl to her pharmacy. Patient was instructed to pick up and take this medication tonight or tomorrow morning. She verbally understood and agreed to do so.    Recent Labs Lab 07/02/13 1547  NA 139  K 3.3*  CL 99  CO2 26  GLUCOSE 120*  BUN 7  CREATININE 0.62  CALCIUM 7.8*   Ivor Costa, MD PGY3, Internal Medicine Teaching Service Pager: [phone]

## 2013-07-02 NOTE — Progress Notes (Signed)
   Subjective:    Patient ID: Brenda Delacruz, female    DOB: December 03, 1960, 53 y.o.   MRN: [id number]  HPI Brenda Delacruz is a 53 yo woman pmh as listed below presents for follow up on URI symptoms not improving. She is accompanied by her husband whom wants to provide most of the history.  Pt was seen on 06/26/13 for URI symptoms thought to be 2/2 viral illness and was given tessalon pearls for symptomatic management. Since that time pt has been having ongoing cough despite taking the tessalon pearls. Her cough keeps her up at night and causes some SOB. She feels that the coughs can exacerbate her anxiety symptoms but feels these "sob attacks" are different than her regular panic attacks. Since her last visit she has been feeling increasingly fatigued and having decreased energy to do regular ADLs. She has continued to not take her BP meds since her last visit. She continues to also have diarrhea and decreased appetite since that time and has therefore had decreased po intake. Her and her husband visit the health system a lot given his recent cancer diagnosis and therefore are exposed to a lot of sick contacts but had no direct relatives with similar symptoms. Per chart review it appears that the pt is also being tapered off her Xanax by Dr. Casimiro Needle for presumed GAD treatment.   Review of Systems  Constitutional: Positive for fever, chills, activity change and fatigue.  HENT: Positive for congestion and postnasal drip. Negative for sinus pressure, sneezing, sore throat and tinnitus.   Eyes: Negative for visual disturbance.  Respiratory: Positive for cough, chest tightness and shortness of breath.   Cardiovascular: Negative for chest pain, palpitations and leg swelling.  Gastrointestinal: Positive for nausea and diarrhea. Negative for vomiting, abdominal pain and constipation.  Genitourinary: Negative for flank pain, decreased urine volume and difficulty urinating.  Musculoskeletal: Negative for arthralgias and  myalgias.  Neurological: Positive for headaches. Negative for dizziness, syncope, light-headedness and numbness.    Past Medical History  Diagnosis Date  . Anemia   . Anxiety   . Depression   . Gallstones   . GERD (gastroesophageal reflux disease)   . Obesity    Social, surgical, family history reviewed with patient and updated in appropriate chart locations.     Objective:   Physical Exam Filed Vitals:   07/02/13 1326  BP: 88/56  Pulse: 100  Temp: 97 F (36.1 C)   repeat after IVF bolus:  Filed Vitals:   07/02/13 1610  BP: 111/55  Pulse: 104  Temp:     General: sitting in chair, anxious,  HEENT: PERRL, EOMI, no scleral icterus Cardiac: tachy, RR, no rubs, murmurs or gallops Pulm: taking shallow breaths moving decreased volumes of air this improved after duoneb with better inspiration and no crackles, wheezes or rhonchi Abd: soft, nontender, nondistended, BS present Ext: warm and well perfused, no pedal edema Neuro: alert and oriented X3, cranial nerves II-XII grossly intact    Assessment & Plan:  Please see problem oriented charting  Pt discussed with Dr. Lynnae January

## 2013-07-03 DIAGNOSIS — I959 Hypotension, unspecified: Secondary | ICD-10-CM | POA: Insufficient documentation

## 2013-07-03 DIAGNOSIS — R0602 Shortness of breath: Secondary | ICD-10-CM | POA: Insufficient documentation

## 2013-07-03 DIAGNOSIS — A084 Viral intestinal infection, unspecified: Secondary | ICD-10-CM | POA: Insufficient documentation

## 2013-07-03 NOTE — Assessment & Plan Note (Signed)
BP Readings from Last 3 Encounters:  07/02/13 111/55  06/26/13 90/60  02/10/13 111/93   Pt had continued to have decreased BP since 06/26/13 visit when GI symptoms began. Pt BP improved and pt symptomatically felt better once IVF bolus provided. Most likely in setting of decreased po intake and diarrhea.  -pt still to discontinue HCTZ for previous HTN

## 2013-07-03 NOTE — Assessment & Plan Note (Signed)
Pt with some initial hypoxia but pt was taking some shallow breaths. This dramatically improved when pt was reassured and also provided duoneb. Her O2 saturation began to normalize and no abnormal PE findings were found. Pt may have viral URI in combination with viral GE.  -duoneb provided in clinic -symptomatic management mucinex, tussin dm cough syrup

## 2013-07-03 NOTE — Assessment & Plan Note (Signed)
Given ongoing diarrhea and decreased po intake leads to GI source. Pt also slightly hypotensive in clinic and slightly hypokalemic indicating possible etiology to volume contraction. Pt without documented fever.  -1L NS bolus given in clinic  -bmet, cbc  -symptomatic management

## 2013-07-04 ENCOUNTER — Emergency Department (HOSPITAL_COMMUNITY)
Admission: EM | Admit: 2013-07-04 | Discharge: 2013-07-05 | Disposition: E | Payer: Medicaid Other | Attending: Emergency Medicine | Admitting: Emergency Medicine

## 2013-07-04 ENCOUNTER — Other Ambulatory Visit: Payer: Self-pay

## 2013-07-04 ENCOUNTER — Encounter (HOSPITAL_COMMUNITY): Payer: Self-pay | Admitting: Emergency Medicine

## 2013-07-04 DIAGNOSIS — D649 Anemia, unspecified: Secondary | ICD-10-CM | POA: Insufficient documentation

## 2013-07-04 DIAGNOSIS — J9690 Respiratory failure, unspecified, unspecified whether with hypoxia or hypercapnia: Secondary | ICD-10-CM

## 2013-07-04 DIAGNOSIS — E669 Obesity, unspecified: Secondary | ICD-10-CM | POA: Insufficient documentation

## 2013-07-04 DIAGNOSIS — F411 Generalized anxiety disorder: Secondary | ICD-10-CM | POA: Insufficient documentation

## 2013-07-04 DIAGNOSIS — I469 Cardiac arrest, cause unspecified: Secondary | ICD-10-CM

## 2013-07-04 DIAGNOSIS — Z79899 Other long term (current) drug therapy: Secondary | ICD-10-CM | POA: Insufficient documentation

## 2013-07-04 DIAGNOSIS — K219 Gastro-esophageal reflux disease without esophagitis: Secondary | ICD-10-CM | POA: Insufficient documentation

## 2013-07-04 DIAGNOSIS — F329 Major depressive disorder, single episode, unspecified: Secondary | ICD-10-CM | POA: Insufficient documentation

## 2013-07-04 DIAGNOSIS — Z87891 Personal history of nicotine dependence: Secondary | ICD-10-CM | POA: Insufficient documentation

## 2013-07-04 DIAGNOSIS — F3289 Other specified depressive episodes: Secondary | ICD-10-CM | POA: Insufficient documentation

## 2013-07-04 DIAGNOSIS — J9600 Acute respiratory failure, unspecified whether with hypoxia or hypercapnia: Secondary | ICD-10-CM | POA: Insufficient documentation

## 2013-07-04 LAB — POCT I-STAT, CHEM 8
BUN: 6 mg/dL (ref 6–23)
CALCIUM ION: 1.07 mmol/L — AB (ref 1.12–1.23)
CREATININE: 0.8 mg/dL (ref 0.50–1.10)
Chloride: 102 mEq/L (ref 96–112)
Glucose, Bld: 145 mg/dL — ABNORMAL HIGH (ref 70–99)
HCT: 37 % (ref 36.0–46.0)
Hemoglobin: 12.6 g/dL (ref 12.0–15.0)
Potassium: 4.6 mEq/L (ref 3.7–5.3)
Sodium: 135 mEq/L — ABNORMAL LOW (ref 137–147)
TCO2: 14 mmol/L (ref 0–100)

## 2013-07-04 LAB — GLUCOSE, CAPILLARY: Glucose-Capillary: 148 mg/dL — ABNORMAL HIGH (ref 70–99)

## 2013-07-04 MED ORDER — DEXTROSE 5 % IV SOLN
3600.0000 mg | INTRAVENOUS | Status: DC | PRN
  Administered 2013-07-04: 300 mg via INTRAVENOUS

## 2013-07-04 MED ORDER — SODIUM CHLORIDE 0.9 % IV SOLN
INTRAVENOUS | Status: DC | PRN
  Administered 2013-07-04: 1000 mL via INTRAVENOUS

## 2013-07-04 MED ORDER — ATROPINE SULFATE 1 MG/ML IJ SOLN
INTRAMUSCULAR | Status: DC | PRN
  Administered 2013-07-04: 1 mg via INTRAVENOUS

## 2013-07-04 MED ORDER — SODIUM BICARBONATE 8.4 % IV SOLN
INTRAVENOUS | Status: DC | PRN
  Administered 2013-07-04 (×3): 50 meq via INTRAVENOUS

## 2013-07-04 MED ORDER — EPINEPHRINE HCL 0.1 MG/ML IJ SOSY
PREFILLED_SYRINGE | INTRAMUSCULAR | Status: DC | PRN
  Administered 2013-07-04 (×7): 1 mg via INTRAVENOUS

## 2013-07-04 MED ORDER — CALCIUM CHLORIDE 10 % IV SOLN
INTRAVENOUS | Status: DC | PRN
  Administered 2013-07-04: 1 g via INTRAVENOUS

## 2013-07-05 DIAGNOSIS — B9789 Other viral agents as the cause of diseases classified elsewhere: Secondary | ICD-10-CM | POA: Insufficient documentation

## 2013-07-05 DIAGNOSIS — J988 Other specified respiratory disorders: Secondary | ICD-10-CM

## 2013-07-05 MED FILL — Medication: Qty: 1 | Status: AC

## 2013-07-05 NOTE — Code Documentation (Signed)
Patient time of death occurred at 45

## 2013-07-05 NOTE — ED Notes (Signed)
Per EMS- pt called out reference shortness of breath, was blue and pale upon arrival and moving little air, started on breathing treatment, given solumedrol, EPI IM 0.15mg , pt became pulses en route and CPR was initiated at 1618, pt given EPI x3 en route, intubated, pt PEA throughout transport, CBG 117. CPR continued upon arrival, ventilation via BVM.

## 2013-07-05 NOTE — Code Documentation (Signed)
Bedside cardiac ultrasound with some cardiac activity, no pulse, CPR resumed

## 2013-07-05 NOTE — ED Notes (Signed)
Continuing to wait for family to arrive.

## 2013-07-05 NOTE — ED Notes (Signed)
Family remains at bedside.

## 2013-07-05 NOTE — ED Provider Notes (Signed)
CHIEF COMPLAINT: Cardiac arrest  HPI: Pt is a 53 y.o. female with a history of depression, anxiety, GERD, obesity who presents to the emergency department in cardiac arrest. Per EMS, they were called to patient's home for respiratory distress. Upon their arrival, patient was cyanotic and had minimal air movement. Her oxygen saturation was in the 50s on room air. She was given 10 mg of albuterol, 0.5 mg of Atrovent, 0.15 mg of IM epinephrine. The attempted CPAP but patient was unable to tolerate this. In route, patient lost pulses and CPR was started. Patient was in PEA. She was given 3 rounds of epinephrine by EMS and intubated using a 8.0 endotracheal tube.  Per EMS and family, patient had no known history of COPD or asthma. She does not have a chronic oxygen requirement. Patient has had several days of feeling poorly with intermittent left arm pain, left chest and jaw pain, shortness of breath, cough.  ROS: Unobtainable due to patient's condition  PAST MEDICAL HISTORY/PAST SURGICAL HISTORY:  Past Medical History  Diagnosis Date  . Anemia   . Anxiety   . Depression   . Gallstones   . GERD (gastroesophageal reflux disease)   . Obesity     MEDICATIONS:  Prior to Admission medications   Medication Sig Start Date End Date Taking? Authorizing Provider  albuterol (PROVENTIL HFA;VENTOLIN HFA) 108 (90 BASE) MCG/ACT inhaler Inhale 2 puffs into the lungs every 6 (six) hours as needed for wheezing or shortness of breath. 07/02/13   Clinton Gallant, MD  ALPRAZolam Duanne Moron) 1 MG tablet 1 qid 05/13/13   Norma Fredrickson, MD  benzonatate (TESSALON PERLES) 100 MG capsule Take 1 capsule (100 mg total) by mouth every 6 (six) hours as needed for cough. 06/26/13 06/26/14  Juluis Mire, MD  cyclobenzaprine (FLEXERIL) 10 MG tablet TAKE ONE TAB TWICE PER DAY ONLY AS NEEDED FOR MUSCLE SPASM 03/13/13   Juluis Mire, MD  dextromethorphan-guaiFENesin (TUSSIN DM) 10-100 MG/5ML liquid Take 10 mLs by mouth every 4 (four)  hours as needed for cough. 07/02/13   Clinton Gallant, MD  ferrous gluconate (FERGON) 246 (28 FE) MG tablet Take 1 tablet (246 mg total) by mouth 2 (two) times daily with a meal. 11/14/10 11/14/11  Pedro Earls, MD  FLUoxetine (PROZAC) 20 MG tablet TAKE 2 TABLETS BY MOUTH DAILY 05/13/13   Norma Fredrickson, MD  guaiFENesin (MUCINEX) 600 MG 12 hr tablet Take 1 tablet (600 mg total) by mouth 2 (two) times daily. 07/02/13 07/02/14  Clinton Gallant, MD  hydrochlorothiazide (HYDRODIURIL) 25 MG tablet TAKE 1 TABLET (25 MG TOTAL) BY MOUTH DAILY. 01/16/13   Milta Deiters, MD  hydrocortisone (ANUSOL-HC) 25 MG suppository Place 1 suppository (25 mg total) rectally every 12 (twelve) hours. 07/28/12 07/28/13  Neta Ehlers, MD  Multiple Vitamins-Minerals (MULTIVITAMIN) tablet Take 1 tablet by mouth daily. 06/11/12   Neta Ehlers, MD  omeprazole (PRILOSEC) 20 MG capsule TAKE 1 CAPSULE (20 MG TOTAL) BY MOUTH DAILY. 06/21/12   Bartholomew Crews, MD  potassium chloride 20 MEQ TBCR Take 40 mEq by mouth once. 07/02/13   Ivor Costa, MD  zolpidem (AMBIEN) 10 MG tablet Take 1 tablet (10 mg total) by mouth at bedtime as needed. 04/01/13 05/01/13  Jimmye Norman, PA-C    ALLERGIES:  No Known Allergies  SOCIAL HISTORY:  History  Substance Use Topics  . Smoking status: Former Smoker    Quit date: 07/17/1993  . Smokeless tobacco: Not on file  . Alcohol Use: No  FAMILY HISTORY: Family History  Problem Relation Age of Onset  . Diabetes      family histoy  . Hypertension      family history    EXAM: BP 98/64  Pulse 92  Resp 16  Wt 234 lb (106.142 kg)  SpO2 82%  LMP 05/04/2010 CONSTITUTIONAL: Patient is not alert or responsive, GCS 3 HEAD: Normocephalic EYES: Conjunctivae clear, pupils are fixed and dilated ENT: normal nose; no rhinorrhea; moist mucous membranes; pharynx without lesions noted; endotracheal tube in place NECK: Supple, no meningismus, no LAD  CARD: Patient has no pulse, CPR being performed, no cardiac 2-D on  bedside ultrasound upon arrival RESP: No spontaneous respirations, patient has rhonchorous breath sounds diffusely, endotracheal tube in place with pink frothy liquid in the endotracheal tube ABD/GI: Normal bowel sounds; non-distended; soft BACK:  The back appears normal and is non-tender to palpation, there is no CVA tenderness EXT: Normal ROM in all joints; non-tender to palpation; no edema; normal capillary refill; no cyanosis    SKIN: Patient is cool, cyanotic NEURO: GCS 3, no spontaneous movements   MEDICAL DECISION MAKING: Patient here with respiratory failure leading to cardiac arrest.  Initially the emergency department, patient was in the PEA. She received multiple rounds of epinephrine, bicarbonate and calcium. Patient had return of spontaneous circulation at 4:55 PM. She then deteriorated into ventricular tachycardia without a pulse and was defibrillated with 200 J on 3 separate occasions. Patient then again had return of spontaneous circulation and 5:12 PM and again went into V. tach this time with a pulse. She received 300 mg of IV amiodarone. EKG showed possible sinus tachycardia with new left bundle branch block versus ventricular tachycardia. Patient then quickly decompensated again into sinus bradycardia and was given atropine. She then lost pulses and was in asystole. At this point, patient had been resuscitated for over an hour. She had no spontaneous respirations, GCS of 3 in her pupils were fixed and dilated. Given she likely had significant anoxic brain injury, resuscitated measures were stopped and time of death was called at 5:22 PM.  Discussed with Dr. Jeanette Caprice, medical examiner, who reports this is not a medical examiner case. Discussed with internal medicine resident on call, Dr. Jessee Avers, as this is her primary care physician.  He will discuss with his attending to have her death certificate completed. Updated patient's husband and her  mother-in-law.        CRITICAL CARE Performed by: Nyra Jabs   Total critical care time: 45 minutes  Critical care time was exclusive of separately billable procedures and treating other patients.  Critical care was necessary to treat or prevent imminent or life-threatening deterioration.  Critical care was time spent personally by me on the following activities: development of treatment plan with patient and/or surrogate as well as nursing, discussions with consultants, evaluation of patient's response to treatment, examination of patient, obtaining history from patient or surrogate, ordering and performing treatments and interventions, ordering and review of laboratory studies, ordering and review of radiographic studies, pulse oximetry and re-evaluation of patient's condition.    Cardiopulmonary Resuscitation (CPR) Procedure Note Directed/Performed by: Nyra Jabs I personally directed ancillary staff and/or performed CPR in an effort to regain return of spontaneous circulation and to maintain cardiac, neuro and systemic perfusion.       Date: 01-Aug-2013 17:13  Rate: 148  Rhythm: Sinus tachycardia  QRS Axis: LAD  Intervals: IVCD  ST/T Wave abnormalities: normal  Conduction Disutrbances: none  Narrative Interpretation: Wide-complex, regular tachycardia, possible sinus tachycardia with left bundle branch block versus ventricular tachycardia, does not meet Sgarbossa criteria      Layla MawKristen N Kiren Mcisaac, DO 07/05/13 [phone]

## 2013-07-05 DEATH — deceased

## 2013-07-06 NOTE — Progress Notes (Signed)
Case discussed with Dr. Sadek soon after the resident saw the patient.  We reviewed the resident's history and exam and pertinent patient test results.  I agree with the assessment, diagnosis, and plan of care documented in the resident's note. 

## 2013-07-06 NOTE — Progress Notes (Signed)
I saw and evaluated the patient.  I personally confirmed the key portions of the history and exam documented by Dr. Rabbani and I reviewed pertinent patient test results.  The assessment, diagnosis, and plan were formulated together and I agree with the documentation in the resident's note.  

## 2013-07-29 ENCOUNTER — Ambulatory Visit (HOSPITAL_COMMUNITY): Payer: Self-pay | Admitting: Psychiatry

## 2013-08-02 NOTE — Progress Notes (Signed)
Chaplain requested to ED for CPR patient, who died in the ED. Patient's husband, husband's mother, son, and niece arrived later. Chaplain present with physician and nurse during death notification with the deceased's husband and his mother. Chaplain provided extensive emotional support and empathic listening to pt's husband who was very upset. His mother said, "He has some psychological problems."   Chaplain provided emotional support to deceased's son when he arrived. The son said he "was in shock and had no idea what to do." The son said he was deceased's primary contact, with his grandmother the secondary contact.  Chaplain collected their contact information for RN and gave son a patient placement card to call with funeral home information. He asked about "what to do" about funeral arrangements. Chaplain provided resources with extensive lists of [facility] and information for long-term grief support. Chaplain escorted family to visit the deceased in [place].   Provided empathic listening, hospitality, crisis and grief support, emotional and spiritual care, resources, and a caring presence. Family was very grateful for chaplain support.   Ethelene Browns [phone]

## 2014-03-11 IMAGING — CR DG CHEST 2V
2 series · 2 of 2 positions shown · non-contrast
Comparison: None.

CLINICAL DATA: Productive cough, shortness of breath for 3 weeks,
some chest soreness, former smoking history

CHEST - 2 VIEW

[w chest pa]
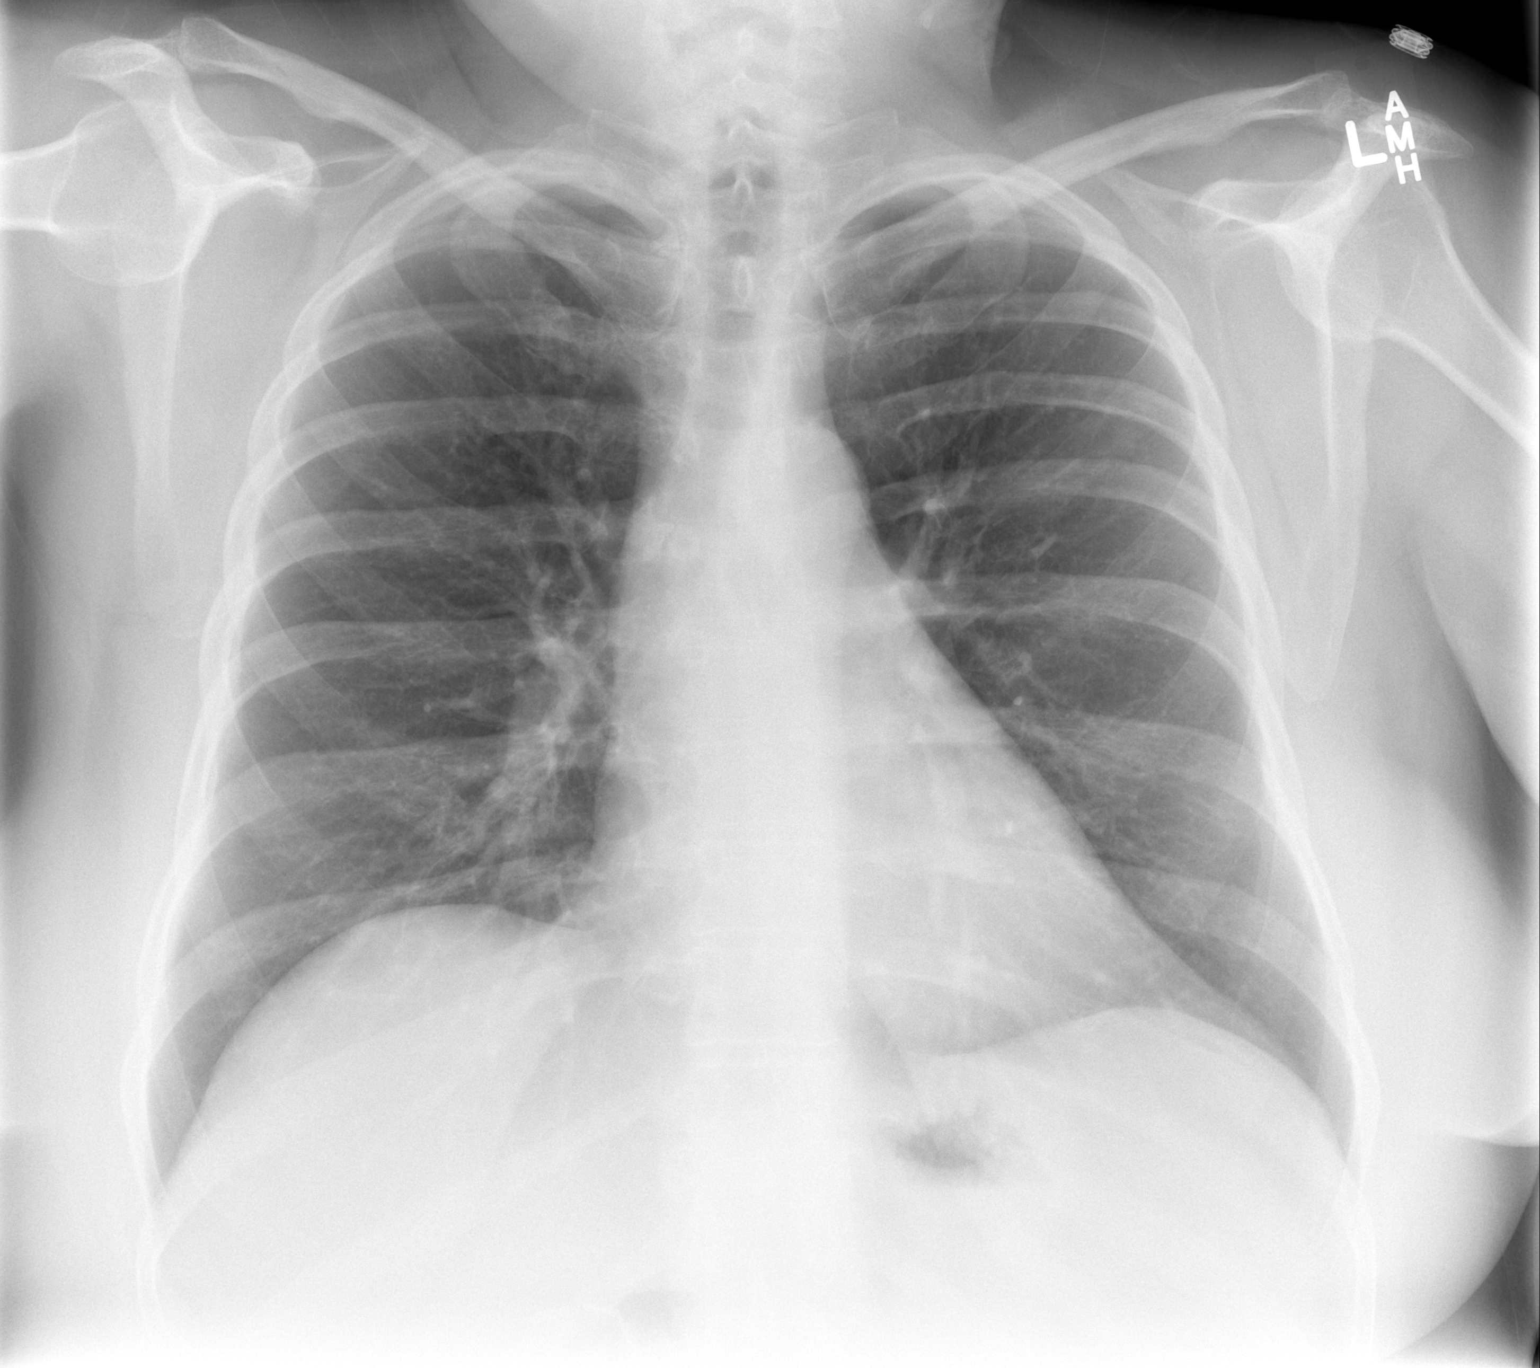

[w chest lat *]
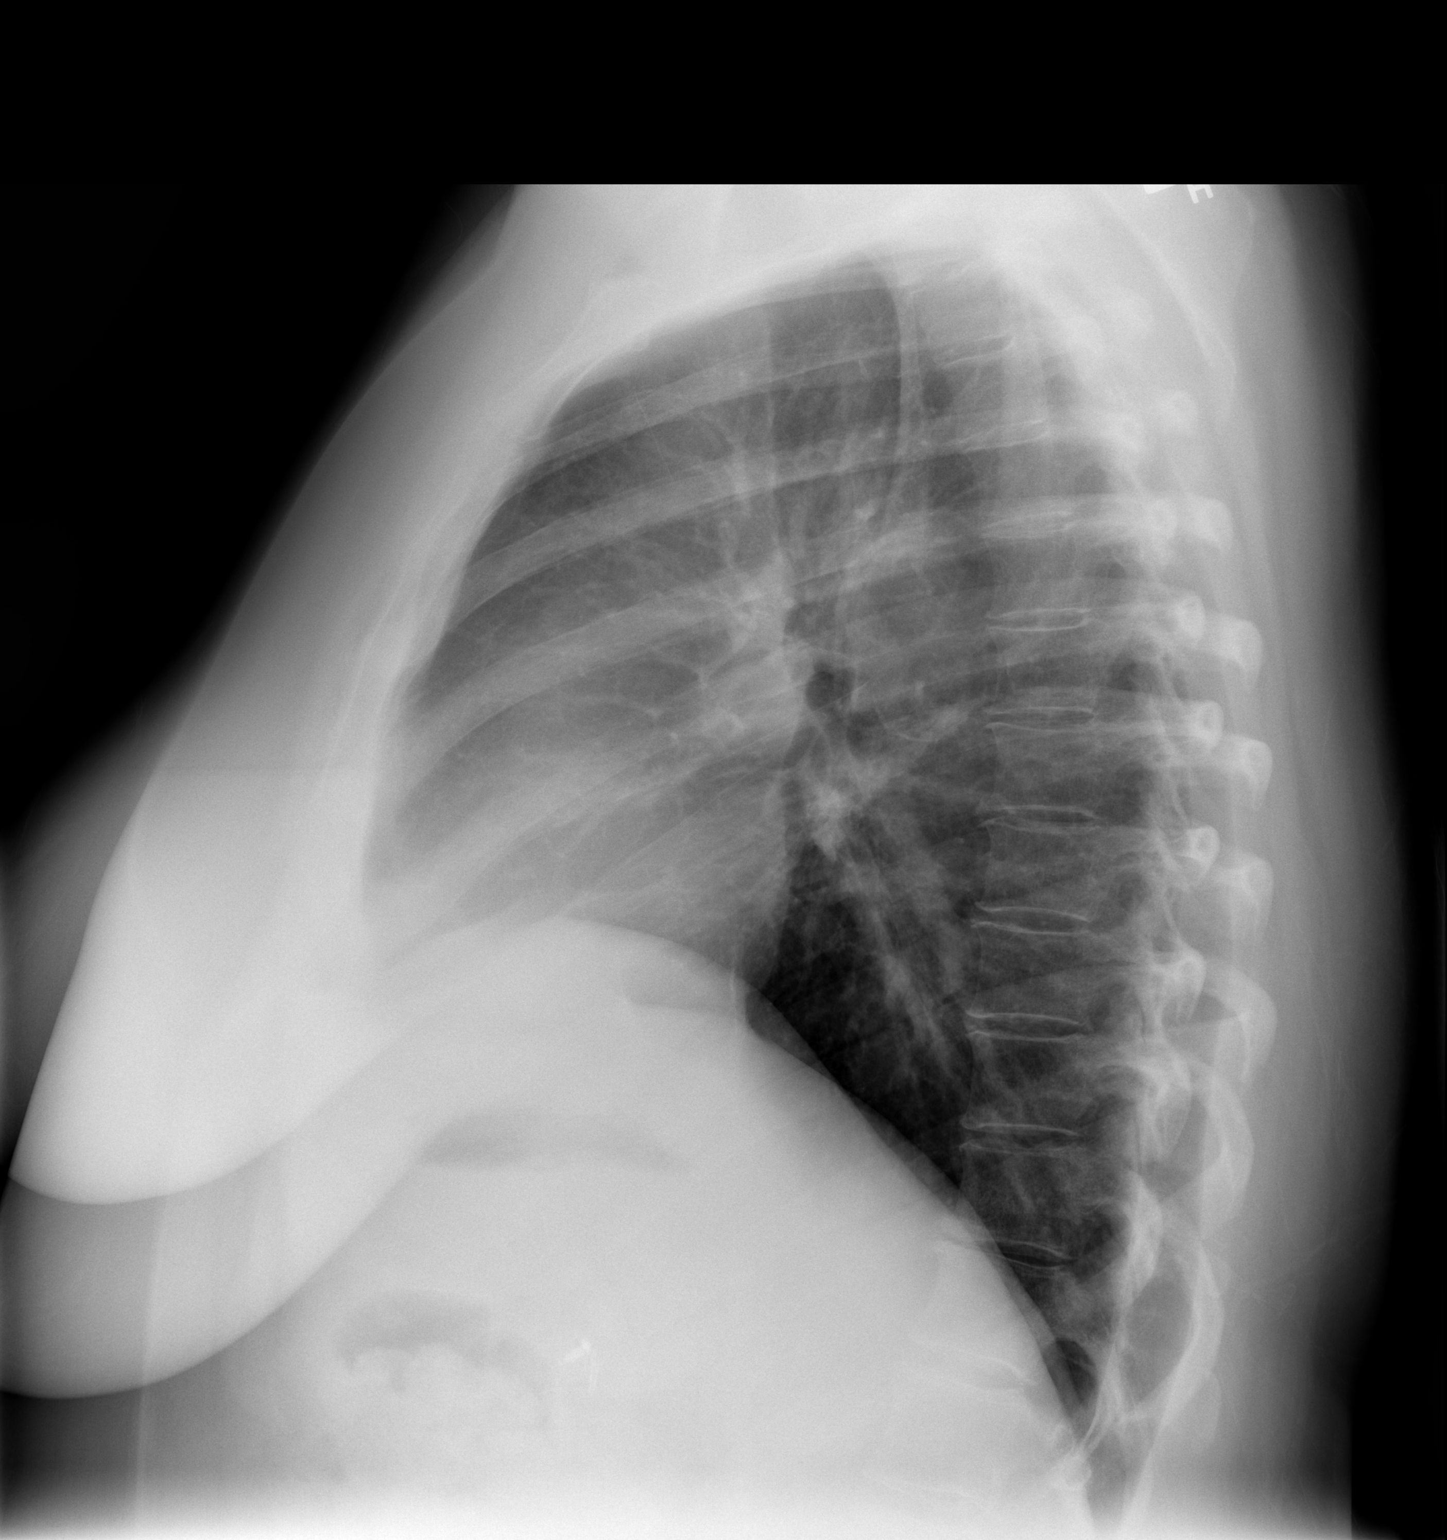

[2 of 2 positions shown; findings below may reference images not displayed]

FINDINGS: No active infiltrate or effusion is seen.  Mediastinal
contours appear normal.  The heart is within normal limits in size.
No bony abnormality is seen.
IMPRESSION: No active lung disease.

## 2015-06-29 ENCOUNTER — Encounter: Payer: Self-pay | Admitting: Gastroenterology
